# Patient Record
Sex: Male | Born: 1940
Health system: Southern US, Community
[De-identification: ages and names within clinical notes are randomized; demographics above are authoritative.]

## PROBLEM LIST (undated history)

## (undated) DIAGNOSIS — C801 Malignant (primary) neoplasm, unspecified: Secondary | ICD-10-CM

## (undated) DIAGNOSIS — T8859XA Other complications of anesthesia, initial encounter: Secondary | ICD-10-CM

## (undated) DIAGNOSIS — K219 Gastro-esophageal reflux disease without esophagitis: Secondary | ICD-10-CM

## (undated) DIAGNOSIS — M1990 Unspecified osteoarthritis, unspecified site: Secondary | ICD-10-CM

## (undated) DIAGNOSIS — G4730 Sleep apnea, unspecified: Secondary | ICD-10-CM

## (undated) DIAGNOSIS — E119 Type 2 diabetes mellitus without complications: Secondary | ICD-10-CM

## (undated) HISTORY — PX: OTHER SURGICAL HISTORY: SHX169

## (undated) HISTORY — PX: BACK SURGERY: SHX140

---

## 2007-09-30 ENCOUNTER — Ambulatory Visit: Payer: Self-pay | Admitting: Internal Medicine

## 2007-12-27 ENCOUNTER — Ambulatory Visit: Payer: Self-pay | Admitting: Internal Medicine

## 2008-01-03 ENCOUNTER — Ambulatory Visit: Payer: Self-pay | Admitting: Internal Medicine

## 2009-04-30 ENCOUNTER — Ambulatory Visit: Payer: Self-pay | Admitting: Internal Medicine

## 2009-06-13 ENCOUNTER — Encounter: Admission: RE | Admit: 2009-06-13 | Discharge: 2009-06-13 | Payer: Self-pay | Admitting: Neurosurgery

## 2010-04-24 ENCOUNTER — Emergency Department: Payer: Self-pay | Admitting: Emergency Medicine

## 2011-06-09 ENCOUNTER — Ambulatory Visit: Payer: Self-pay | Admitting: Internal Medicine

## 2011-07-22 ENCOUNTER — Other Ambulatory Visit: Payer: Self-pay | Admitting: Neurosurgery

## 2011-07-22 DIAGNOSIS — M542 Cervicalgia: Secondary | ICD-10-CM

## 2011-07-22 DIAGNOSIS — M5410 Radiculopathy, site unspecified: Secondary | ICD-10-CM

## 2011-07-22 DIAGNOSIS — M549 Dorsalgia, unspecified: Secondary | ICD-10-CM

## 2011-08-24 ENCOUNTER — Ambulatory Visit
Admission: RE | Admit: 2011-08-24 | Discharge: 2011-08-24 | Disposition: A | Discharge status: Home / Self Care | Payer: Medicare Other | Source: Ambulatory Visit | Attending: Neurosurgery | Admitting: Neurosurgery

## 2011-08-24 DIAGNOSIS — M542 Cervicalgia: Secondary | ICD-10-CM

## 2011-08-24 DIAGNOSIS — M5410 Radiculopathy, site unspecified: Secondary | ICD-10-CM

## 2011-08-24 DIAGNOSIS — M549 Dorsalgia, unspecified: Secondary | ICD-10-CM

## 2011-08-24 MED ORDER — IOHEXOL 300 MG/ML  SOLN
10.0000 mL | Freq: Once | INTRAMUSCULAR | Status: AC | PRN
  Administered 2011-08-24: 10 mL via INTRATHECAL

## 2011-08-24 MED ORDER — ONDANSETRON HCL 4 MG/2ML IJ SOLN: 4.0000 mg | Freq: Four times a day (QID) | INTRAMUSCULAR | Status: DC | PRN

## 2011-08-24 MED ORDER — DIAZEPAM 5 MG PO TABS
5.0000 mg | ORAL_TABLET | Freq: Once | ORAL | Status: AC
  Administered 2011-08-24: 5 mg via ORAL

## 2011-08-24 NOTE — Progress Notes (Signed)
Explained discharge instructions, consent signed and valium given. Procedure explained as well.

## 2013-05-05 ENCOUNTER — Ambulatory Visit: Payer: Self-pay | Admitting: Internal Medicine

## 2013-05-29 ENCOUNTER — Other Ambulatory Visit: Payer: Self-pay | Admitting: Neurosurgery

## 2013-05-29 DIAGNOSIS — M545 Low back pain, unspecified: Secondary | ICD-10-CM | POA: Insufficient documentation

## 2013-05-30 ENCOUNTER — Ambulatory Visit
Admission: RE | Admit: 2013-05-30 | Discharge: 2013-05-30 | Disposition: A | Discharge status: Home / Self Care | Payer: Medicare Other | Source: Ambulatory Visit | Attending: Neurosurgery | Admitting: Neurosurgery

## 2013-05-30 DIAGNOSIS — M545 Low back pain: Secondary | ICD-10-CM

## 2013-05-31 ENCOUNTER — Other Ambulatory Visit: Payer: Medicare Other

## 2013-08-15 DIAGNOSIS — R197 Diarrhea, unspecified: Secondary | ICD-10-CM | POA: Diagnosis not present

## 2013-08-15 DIAGNOSIS — M5430 Sciatica, unspecified side: Secondary | ICD-10-CM | POA: Diagnosis not present

## 2013-08-15 DIAGNOSIS — E119 Type 2 diabetes mellitus without complications: Secondary | ICD-10-CM | POA: Diagnosis not present

## 2013-08-15 DIAGNOSIS — R413 Other amnesia: Secondary | ICD-10-CM | POA: Diagnosis not present

## 2013-08-21 DIAGNOSIS — Z79899 Other long term (current) drug therapy: Secondary | ICD-10-CM | POA: Diagnosis not present

## 2013-08-21 DIAGNOSIS — M48061 Spinal stenosis, lumbar region without neurogenic claudication: Secondary | ICD-10-CM | POA: Diagnosis not present

## 2013-08-23 ENCOUNTER — Ambulatory Visit: Payer: Self-pay | Admitting: Internal Medicine

## 2013-08-23 DIAGNOSIS — K7689 Other specified diseases of liver: Secondary | ICD-10-CM | POA: Diagnosis not present

## 2013-08-23 DIAGNOSIS — R109 Unspecified abdominal pain: Secondary | ICD-10-CM | POA: Diagnosis not present

## 2013-09-18 DIAGNOSIS — M5430 Sciatica, unspecified side: Secondary | ICD-10-CM | POA: Diagnosis not present

## 2013-09-18 DIAGNOSIS — K219 Gastro-esophageal reflux disease without esophagitis: Secondary | ICD-10-CM | POA: Diagnosis not present

## 2013-09-18 DIAGNOSIS — M5030 Other cervical disc degeneration, unspecified cervical region: Secondary | ICD-10-CM | POA: Diagnosis not present

## 2013-09-18 DIAGNOSIS — E119 Type 2 diabetes mellitus without complications: Secondary | ICD-10-CM | POA: Diagnosis not present

## 2013-10-03 DIAGNOSIS — D0460 Carcinoma in situ of skin of unspecified upper limb, including shoulder: Secondary | ICD-10-CM | POA: Diagnosis not present

## 2013-10-03 DIAGNOSIS — R197 Diarrhea, unspecified: Secondary | ICD-10-CM | POA: Diagnosis not present

## 2013-10-03 DIAGNOSIS — R1013 Epigastric pain: Secondary | ICD-10-CM | POA: Diagnosis not present

## 2013-10-03 DIAGNOSIS — R12 Heartburn: Secondary | ICD-10-CM | POA: Diagnosis not present

## 2013-10-03 DIAGNOSIS — R110 Nausea: Secondary | ICD-10-CM | POA: Diagnosis not present

## 2013-10-03 DIAGNOSIS — D0430 Carcinoma in situ of skin of unspecified part of face: Secondary | ICD-10-CM | POA: Diagnosis not present

## 2013-10-03 DIAGNOSIS — D0439 Carcinoma in situ of skin of other parts of face: Secondary | ICD-10-CM | POA: Diagnosis not present

## 2013-10-11 ENCOUNTER — Ambulatory Visit: Payer: Self-pay | Admitting: Gastroenterology

## 2013-10-11 DIAGNOSIS — R1011 Right upper quadrant pain: Secondary | ICD-10-CM | POA: Diagnosis not present

## 2013-10-11 DIAGNOSIS — R748 Abnormal levels of other serum enzymes: Secondary | ICD-10-CM | POA: Diagnosis not present

## 2013-10-11 DIAGNOSIS — R1013 Epigastric pain: Secondary | ICD-10-CM | POA: Diagnosis not present

## 2013-10-12 DIAGNOSIS — C44211 Basal cell carcinoma of skin of unspecified ear and external auricular canal: Secondary | ICD-10-CM | POA: Diagnosis not present

## 2013-10-13 ENCOUNTER — Ambulatory Visit: Payer: Self-pay | Admitting: Anesthesiology

## 2013-10-13 DIAGNOSIS — M5430 Sciatica, unspecified side: Secondary | ICD-10-CM | POA: Diagnosis not present

## 2013-10-13 DIAGNOSIS — Z01810 Encounter for preprocedural cardiovascular examination: Secondary | ICD-10-CM | POA: Diagnosis not present

## 2013-10-13 DIAGNOSIS — E119 Type 2 diabetes mellitus without complications: Secondary | ICD-10-CM | POA: Diagnosis not present

## 2013-10-13 DIAGNOSIS — M4800 Spinal stenosis, site unspecified: Secondary | ICD-10-CM | POA: Diagnosis not present

## 2013-10-13 DIAGNOSIS — M5030 Other cervical disc degeneration, unspecified cervical region: Secondary | ICD-10-CM | POA: Diagnosis not present

## 2013-10-16 ENCOUNTER — Ambulatory Visit: Payer: Self-pay | Admitting: Gastroenterology

## 2013-10-16 DIAGNOSIS — D126 Benign neoplasm of colon, unspecified: Secondary | ICD-10-CM | POA: Diagnosis not present

## 2013-10-16 DIAGNOSIS — Z9109 Other allergy status, other than to drugs and biological substances: Secondary | ICD-10-CM | POA: Diagnosis not present

## 2013-10-16 DIAGNOSIS — M5030 Other cervical disc degeneration, unspecified cervical region: Secondary | ICD-10-CM | POA: Diagnosis not present

## 2013-10-16 DIAGNOSIS — I10 Essential (primary) hypertension: Secondary | ICD-10-CM | POA: Diagnosis not present

## 2013-10-16 DIAGNOSIS — R1013 Epigastric pain: Secondary | ICD-10-CM | POA: Diagnosis not present

## 2013-10-16 DIAGNOSIS — M4800 Spinal stenosis, site unspecified: Secondary | ICD-10-CM | POA: Diagnosis not present

## 2013-10-16 DIAGNOSIS — E669 Obesity, unspecified: Secondary | ICD-10-CM | POA: Diagnosis not present

## 2013-10-16 DIAGNOSIS — K209 Esophagitis, unspecified without bleeding: Secondary | ICD-10-CM | POA: Diagnosis not present

## 2013-10-16 DIAGNOSIS — Z6829 Body mass index (BMI) 29.0-29.9, adult: Secondary | ICD-10-CM | POA: Diagnosis not present

## 2013-10-16 DIAGNOSIS — Z882 Allergy status to sulfonamides status: Secondary | ICD-10-CM | POA: Diagnosis not present

## 2013-10-16 DIAGNOSIS — M129 Arthropathy, unspecified: Secondary | ICD-10-CM | POA: Diagnosis not present

## 2013-10-16 DIAGNOSIS — K219 Gastro-esophageal reflux disease without esophagitis: Secondary | ICD-10-CM | POA: Diagnosis not present

## 2013-10-16 DIAGNOSIS — K2970 Gastritis, unspecified, without bleeding: Secondary | ICD-10-CM | POA: Diagnosis not present

## 2013-10-16 DIAGNOSIS — K648 Other hemorrhoids: Secondary | ICD-10-CM | POA: Diagnosis not present

## 2013-10-16 DIAGNOSIS — Z79899 Other long term (current) drug therapy: Secondary | ICD-10-CM | POA: Diagnosis not present

## 2013-10-16 DIAGNOSIS — M5430 Sciatica, unspecified side: Secondary | ICD-10-CM | POA: Diagnosis not present

## 2013-10-16 DIAGNOSIS — Z1211 Encounter for screening for malignant neoplasm of colon: Secondary | ICD-10-CM | POA: Diagnosis not present

## 2013-10-16 DIAGNOSIS — D129 Benign neoplasm of anus and anal canal: Secondary | ICD-10-CM | POA: Diagnosis not present

## 2013-10-16 DIAGNOSIS — K621 Rectal polyp: Secondary | ICD-10-CM | POA: Diagnosis not present

## 2013-10-16 DIAGNOSIS — K2100 Gastro-esophageal reflux disease with esophagitis, without bleeding: Secondary | ICD-10-CM | POA: Diagnosis not present

## 2013-10-16 DIAGNOSIS — K620 Anal polyp: Secondary | ICD-10-CM | POA: Diagnosis not present

## 2013-10-16 DIAGNOSIS — K2990 Gastroduodenitis, unspecified, without bleeding: Secondary | ICD-10-CM | POA: Diagnosis not present

## 2013-10-16 DIAGNOSIS — R197 Diarrhea, unspecified: Secondary | ICD-10-CM | POA: Diagnosis not present

## 2013-10-16 DIAGNOSIS — F29 Unspecified psychosis not due to a substance or known physiological condition: Secondary | ICD-10-CM | POA: Diagnosis not present

## 2013-10-16 DIAGNOSIS — Z7982 Long term (current) use of aspirin: Secondary | ICD-10-CM | POA: Diagnosis not present

## 2013-11-29 ENCOUNTER — Ambulatory Visit: Payer: Self-pay | Admitting: Internal Medicine

## 2013-11-29 DIAGNOSIS — Z85820 Personal history of malignant melanoma of skin: Secondary | ICD-10-CM | POA: Diagnosis not present

## 2013-11-29 DIAGNOSIS — C439 Malignant melanoma of skin, unspecified: Secondary | ICD-10-CM | POA: Diagnosis not present

## 2013-12-08 DIAGNOSIS — M5030 Other cervical disc degeneration, unspecified cervical region: Secondary | ICD-10-CM | POA: Diagnosis not present

## 2013-12-08 DIAGNOSIS — E119 Type 2 diabetes mellitus without complications: Secondary | ICD-10-CM | POA: Diagnosis not present

## 2013-12-08 DIAGNOSIS — M4800 Spinal stenosis, site unspecified: Secondary | ICD-10-CM | POA: Diagnosis not present

## 2013-12-08 DIAGNOSIS — M5430 Sciatica, unspecified side: Secondary | ICD-10-CM | POA: Diagnosis not present

## 2013-12-23 DIAGNOSIS — R6889 Other general symptoms and signs: Secondary | ICD-10-CM | POA: Diagnosis not present

## 2013-12-23 DIAGNOSIS — R079 Chest pain, unspecified: Secondary | ICD-10-CM | POA: Diagnosis not present

## 2014-01-30 DIAGNOSIS — L570 Actinic keratosis: Secondary | ICD-10-CM | POA: Diagnosis not present

## 2014-01-30 DIAGNOSIS — C44319 Basal cell carcinoma of skin of other parts of face: Secondary | ICD-10-CM | POA: Diagnosis not present

## 2014-01-30 DIAGNOSIS — Z85820 Personal history of malignant melanoma of skin: Secondary | ICD-10-CM | POA: Diagnosis not present

## 2014-01-30 DIAGNOSIS — D485 Neoplasm of uncertain behavior of skin: Secondary | ICD-10-CM | POA: Diagnosis not present

## 2014-01-30 DIAGNOSIS — D0460 Carcinoma in situ of skin of unspecified upper limb, including shoulder: Secondary | ICD-10-CM | POA: Diagnosis not present

## 2014-03-06 DIAGNOSIS — I10 Essential (primary) hypertension: Secondary | ICD-10-CM | POA: Diagnosis not present

## 2014-03-06 DIAGNOSIS — E119 Type 2 diabetes mellitus without complications: Secondary | ICD-10-CM | POA: Diagnosis not present

## 2014-03-06 DIAGNOSIS — M5030 Other cervical disc degeneration, unspecified cervical region: Secondary | ICD-10-CM | POA: Diagnosis not present

## 2014-03-06 DIAGNOSIS — Z125 Encounter for screening for malignant neoplasm of prostate: Secondary | ICD-10-CM | POA: Diagnosis not present

## 2014-03-06 DIAGNOSIS — Z0000 Encounter for general adult medical examination without abnormal findings: Secondary | ICD-10-CM | POA: Diagnosis not present

## 2014-03-06 DIAGNOSIS — D649 Anemia, unspecified: Secondary | ICD-10-CM | POA: Diagnosis not present

## 2014-03-06 DIAGNOSIS — E785 Hyperlipidemia, unspecified: Secondary | ICD-10-CM | POA: Diagnosis not present

## 2014-03-13 DIAGNOSIS — E119 Type 2 diabetes mellitus without complications: Secondary | ICD-10-CM | POA: Diagnosis not present

## 2014-03-13 DIAGNOSIS — M5030 Other cervical disc degeneration, unspecified cervical region: Secondary | ICD-10-CM | POA: Diagnosis not present

## 2014-03-13 DIAGNOSIS — M4800 Spinal stenosis, site unspecified: Secondary | ICD-10-CM | POA: Diagnosis not present

## 2014-03-19 DIAGNOSIS — C44320 Squamous cell carcinoma of skin of unspecified parts of face: Secondary | ICD-10-CM | POA: Diagnosis not present

## 2014-03-20 DIAGNOSIS — R972 Elevated prostate specific antigen [PSA]: Secondary | ICD-10-CM | POA: Diagnosis not present

## 2014-03-20 DIAGNOSIS — N401 Enlarged prostate with lower urinary tract symptoms: Secondary | ICD-10-CM | POA: Diagnosis not present

## 2014-04-02 DIAGNOSIS — D0460 Carcinoma in situ of skin of unspecified upper limb, including shoulder: Secondary | ICD-10-CM | POA: Diagnosis not present

## 2014-04-03 DIAGNOSIS — J398 Other specified diseases of upper respiratory tract: Secondary | ICD-10-CM | POA: Diagnosis not present

## 2014-04-03 DIAGNOSIS — J441 Chronic obstructive pulmonary disease with (acute) exacerbation: Secondary | ICD-10-CM | POA: Diagnosis not present

## 2014-05-09 DIAGNOSIS — Z23 Encounter for immunization: Secondary | ICD-10-CM | POA: Diagnosis not present

## 2014-06-04 DIAGNOSIS — M4800 Spinal stenosis, site unspecified: Secondary | ICD-10-CM | POA: Diagnosis not present

## 2014-06-04 DIAGNOSIS — M5093 Cervical disc disorder, unspecified, cervicothoracic region: Secondary | ICD-10-CM | POA: Diagnosis not present

## 2014-06-04 DIAGNOSIS — I10 Essential (primary) hypertension: Secondary | ICD-10-CM | POA: Diagnosis not present

## 2014-06-04 DIAGNOSIS — E119 Type 2 diabetes mellitus without complications: Secondary | ICD-10-CM | POA: Diagnosis not present

## 2014-07-09 DIAGNOSIS — R509 Fever, unspecified: Secondary | ICD-10-CM | POA: Diagnosis not present

## 2014-07-09 DIAGNOSIS — J029 Acute pharyngitis, unspecified: Secondary | ICD-10-CM | POA: Diagnosis not present

## 2014-07-09 DIAGNOSIS — J0100 Acute maxillary sinusitis, unspecified: Secondary | ICD-10-CM | POA: Diagnosis not present

## 2014-07-13 DIAGNOSIS — E1165 Type 2 diabetes mellitus with hyperglycemia: Secondary | ICD-10-CM | POA: Diagnosis not present

## 2014-07-13 DIAGNOSIS — J320 Chronic maxillary sinusitis: Secondary | ICD-10-CM | POA: Diagnosis not present

## 2014-07-13 DIAGNOSIS — J399 Disease of upper respiratory tract, unspecified: Secondary | ICD-10-CM | POA: Diagnosis not present

## 2014-07-13 DIAGNOSIS — J0121 Acute recurrent ethmoidal sinusitis: Secondary | ICD-10-CM | POA: Diagnosis not present

## 2014-07-17 ENCOUNTER — Ambulatory Visit: Payer: Self-pay | Admitting: Internal Medicine

## 2014-07-17 DIAGNOSIS — J399 Disease of upper respiratory tract, unspecified: Secondary | ICD-10-CM | POA: Diagnosis not present

## 2014-07-17 DIAGNOSIS — I10 Essential (primary) hypertension: Secondary | ICD-10-CM | POA: Diagnosis not present

## 2014-07-17 DIAGNOSIS — R091 Pleurisy: Secondary | ICD-10-CM | POA: Diagnosis not present

## 2014-07-17 DIAGNOSIS — M4800 Spinal stenosis, site unspecified: Secondary | ICD-10-CM | POA: Diagnosis not present

## 2014-07-17 DIAGNOSIS — E119 Type 2 diabetes mellitus without complications: Secondary | ICD-10-CM | POA: Diagnosis not present

## 2014-07-17 DIAGNOSIS — J129 Viral pneumonia, unspecified: Secondary | ICD-10-CM | POA: Diagnosis not present

## 2014-07-17 DIAGNOSIS — R05 Cough: Secondary | ICD-10-CM | POA: Diagnosis not present

## 2014-07-17 DIAGNOSIS — E784 Other hyperlipidemia: Secondary | ICD-10-CM | POA: Diagnosis not present

## 2014-07-23 DIAGNOSIS — M5093 Cervical disc disorder, unspecified, cervicothoracic region: Secondary | ICD-10-CM | POA: Diagnosis not present

## 2014-07-23 DIAGNOSIS — M5136 Other intervertebral disc degeneration, lumbar region: Secondary | ICD-10-CM | POA: Diagnosis not present

## 2014-07-23 DIAGNOSIS — M4800 Spinal stenosis, site unspecified: Secondary | ICD-10-CM | POA: Diagnosis not present

## 2014-07-23 DIAGNOSIS — M5416 Radiculopathy, lumbar region: Secondary | ICD-10-CM | POA: Diagnosis not present

## 2014-07-23 DIAGNOSIS — E119 Type 2 diabetes mellitus without complications: Secondary | ICD-10-CM | POA: Diagnosis not present

## 2014-07-23 DIAGNOSIS — M5430 Sciatica, unspecified side: Secondary | ICD-10-CM | POA: Diagnosis not present

## 2014-08-10 DIAGNOSIS — D2271 Melanocytic nevi of right lower limb, including hip: Secondary | ICD-10-CM | POA: Diagnosis not present

## 2014-08-10 DIAGNOSIS — L570 Actinic keratosis: Secondary | ICD-10-CM | POA: Diagnosis not present

## 2014-08-10 DIAGNOSIS — D2261 Melanocytic nevi of right upper limb, including shoulder: Secondary | ICD-10-CM | POA: Diagnosis not present

## 2014-08-10 DIAGNOSIS — D485 Neoplasm of uncertain behavior of skin: Secondary | ICD-10-CM | POA: Diagnosis not present

## 2014-08-10 DIAGNOSIS — D0439 Carcinoma in situ of skin of other parts of face: Secondary | ICD-10-CM | POA: Diagnosis not present

## 2014-08-10 DIAGNOSIS — Z85820 Personal history of malignant melanoma of skin: Secondary | ICD-10-CM | POA: Diagnosis not present

## 2014-08-10 DIAGNOSIS — Z85828 Personal history of other malignant neoplasm of skin: Secondary | ICD-10-CM | POA: Diagnosis not present

## 2014-08-10 DIAGNOSIS — C44311 Basal cell carcinoma of skin of nose: Secondary | ICD-10-CM | POA: Diagnosis not present

## 2014-08-10 DIAGNOSIS — X32XXXA Exposure to sunlight, initial encounter: Secondary | ICD-10-CM | POA: Diagnosis not present

## 2014-08-29 DIAGNOSIS — R972 Elevated prostate specific antigen [PSA]: Secondary | ICD-10-CM | POA: Diagnosis not present

## 2014-09-05 DIAGNOSIS — N400 Enlarged prostate without lower urinary tract symptoms: Secondary | ICD-10-CM | POA: Diagnosis not present

## 2014-09-05 DIAGNOSIS — R972 Elevated prostate specific antigen [PSA]: Secondary | ICD-10-CM | POA: Diagnosis not present

## 2014-09-06 DIAGNOSIS — M5093 Cervical disc disorder, unspecified, cervicothoracic region: Secondary | ICD-10-CM | POA: Diagnosis not present

## 2014-09-06 DIAGNOSIS — M5430 Sciatica, unspecified side: Secondary | ICD-10-CM | POA: Diagnosis not present

## 2014-09-20 DIAGNOSIS — D0439 Carcinoma in situ of skin of other parts of face: Secondary | ICD-10-CM | POA: Diagnosis not present

## 2014-10-30 DIAGNOSIS — M5430 Sciatica, unspecified side: Secondary | ICD-10-CM | POA: Diagnosis not present

## 2014-10-30 DIAGNOSIS — E119 Type 2 diabetes mellitus without complications: Secondary | ICD-10-CM | POA: Diagnosis not present

## 2014-11-13 DIAGNOSIS — C44311 Basal cell carcinoma of skin of nose: Secondary | ICD-10-CM | POA: Diagnosis not present

## 2014-11-28 DIAGNOSIS — E119 Type 2 diabetes mellitus without complications: Secondary | ICD-10-CM | POA: Diagnosis not present

## 2014-11-28 DIAGNOSIS — E1165 Type 2 diabetes mellitus with hyperglycemia: Secondary | ICD-10-CM | POA: Diagnosis not present

## 2014-11-28 DIAGNOSIS — Z1389 Encounter for screening for other disorder: Secondary | ICD-10-CM | POA: Diagnosis not present

## 2014-11-28 DIAGNOSIS — M5430 Sciatica, unspecified side: Secondary | ICD-10-CM | POA: Diagnosis not present

## 2014-12-19 DIAGNOSIS — D485 Neoplasm of uncertain behavior of skin: Secondary | ICD-10-CM | POA: Diagnosis not present

## 2014-12-19 DIAGNOSIS — Z85828 Personal history of other malignant neoplasm of skin: Secondary | ICD-10-CM | POA: Diagnosis not present

## 2014-12-19 DIAGNOSIS — C44619 Basal cell carcinoma of skin of left upper limb, including shoulder: Secondary | ICD-10-CM | POA: Diagnosis not present

## 2014-12-19 DIAGNOSIS — X32XXXA Exposure to sunlight, initial encounter: Secondary | ICD-10-CM | POA: Diagnosis not present

## 2014-12-19 DIAGNOSIS — L570 Actinic keratosis: Secondary | ICD-10-CM | POA: Diagnosis not present

## 2014-12-19 DIAGNOSIS — D0461 Carcinoma in situ of skin of right upper limb, including shoulder: Secondary | ICD-10-CM | POA: Diagnosis not present

## 2014-12-19 DIAGNOSIS — Z85820 Personal history of malignant melanoma of skin: Secondary | ICD-10-CM | POA: Diagnosis not present

## 2014-12-26 DIAGNOSIS — D0461 Carcinoma in situ of skin of right upper limb, including shoulder: Secondary | ICD-10-CM | POA: Diagnosis not present

## 2014-12-26 DIAGNOSIS — C44619 Basal cell carcinoma of skin of left upper limb, including shoulder: Secondary | ICD-10-CM | POA: Diagnosis not present

## 2015-01-28 DIAGNOSIS — M5430 Sciatica, unspecified side: Secondary | ICD-10-CM | POA: Diagnosis not present

## 2015-01-28 DIAGNOSIS — M549 Dorsalgia, unspecified: Secondary | ICD-10-CM | POA: Diagnosis not present

## 2015-01-28 DIAGNOSIS — M4692 Unspecified inflammatory spondylopathy, cervical region: Secondary | ICD-10-CM | POA: Diagnosis not present

## 2015-01-28 DIAGNOSIS — M4800 Spinal stenosis, site unspecified: Secondary | ICD-10-CM | POA: Diagnosis not present

## 2015-03-04 DIAGNOSIS — I10 Essential (primary) hypertension: Secondary | ICD-10-CM | POA: Diagnosis not present

## 2015-03-04 DIAGNOSIS — M5093 Cervical disc disorder, unspecified, cervicothoracic region: Secondary | ICD-10-CM | POA: Diagnosis not present

## 2015-03-04 DIAGNOSIS — M5430 Sciatica, unspecified side: Secondary | ICD-10-CM | POA: Diagnosis not present

## 2015-04-10 DIAGNOSIS — M4800 Spinal stenosis, site unspecified: Secondary | ICD-10-CM | POA: Diagnosis not present

## 2015-04-10 DIAGNOSIS — H9209 Otalgia, unspecified ear: Secondary | ICD-10-CM | POA: Diagnosis not present

## 2015-04-10 DIAGNOSIS — H60391 Other infective otitis externa, right ear: Secondary | ICD-10-CM | POA: Diagnosis not present

## 2015-04-10 DIAGNOSIS — H9201 Otalgia, right ear: Secondary | ICD-10-CM | POA: Diagnosis not present

## 2015-04-23 DIAGNOSIS — J9801 Acute bronchospasm: Secondary | ICD-10-CM | POA: Diagnosis not present

## 2015-04-23 DIAGNOSIS — J069 Acute upper respiratory infection, unspecified: Secondary | ICD-10-CM | POA: Diagnosis not present

## 2015-04-23 DIAGNOSIS — B9789 Other viral agents as the cause of diseases classified elsewhere: Secondary | ICD-10-CM | POA: Diagnosis not present

## 2015-04-23 DIAGNOSIS — J218 Acute bronchiolitis due to other specified organisms: Secondary | ICD-10-CM | POA: Diagnosis not present

## 2015-04-24 DIAGNOSIS — E291 Testicular hypofunction: Secondary | ICD-10-CM | POA: Diagnosis not present

## 2015-04-24 DIAGNOSIS — R972 Elevated prostate specific antigen [PSA]: Secondary | ICD-10-CM | POA: Diagnosis not present

## 2015-04-24 DIAGNOSIS — N5201 Erectile dysfunction due to arterial insufficiency: Secondary | ICD-10-CM | POA: Diagnosis not present

## 2015-05-01 DIAGNOSIS — D2261 Melanocytic nevi of right upper limb, including shoulder: Secondary | ICD-10-CM | POA: Diagnosis not present

## 2015-05-01 DIAGNOSIS — L570 Actinic keratosis: Secondary | ICD-10-CM | POA: Diagnosis not present

## 2015-05-01 DIAGNOSIS — L280 Lichen simplex chronicus: Secondary | ICD-10-CM | POA: Diagnosis not present

## 2015-05-01 DIAGNOSIS — Z85828 Personal history of other malignant neoplasm of skin: Secondary | ICD-10-CM | POA: Diagnosis not present

## 2015-05-01 DIAGNOSIS — D485 Neoplasm of uncertain behavior of skin: Secondary | ICD-10-CM | POA: Diagnosis not present

## 2015-05-01 DIAGNOSIS — X32XXXA Exposure to sunlight, initial encounter: Secondary | ICD-10-CM | POA: Diagnosis not present

## 2015-05-01 DIAGNOSIS — D225 Melanocytic nevi of trunk: Secondary | ICD-10-CM | POA: Diagnosis not present

## 2015-05-01 DIAGNOSIS — Z85820 Personal history of malignant melanoma of skin: Secondary | ICD-10-CM | POA: Diagnosis not present

## 2015-05-02 ENCOUNTER — Other Ambulatory Visit (HOSPITAL_COMMUNITY): Payer: Self-pay | Admitting: Urology

## 2015-05-02 DIAGNOSIS — R972 Elevated prostate specific antigen [PSA]: Secondary | ICD-10-CM

## 2015-05-13 ENCOUNTER — Ambulatory Visit (HOSPITAL_COMMUNITY): Admission: RE | Admit: 2015-05-13 | Payer: Medicare Other | Source: Ambulatory Visit

## 2015-05-14 ENCOUNTER — Ambulatory Visit (HOSPITAL_COMMUNITY)
Admission: RE | Admit: 2015-05-14 | Discharge: 2015-05-14 | Disposition: A | Discharge status: Home / Self Care | Payer: Medicare Other | Source: Ambulatory Visit | Attending: Urology | Admitting: Urology

## 2015-05-14 DIAGNOSIS — K5730 Diverticulosis of large intestine without perforation or abscess without bleeding: Secondary | ICD-10-CM | POA: Diagnosis not present

## 2015-05-14 DIAGNOSIS — R972 Elevated prostate specific antigen [PSA]: Secondary | ICD-10-CM | POA: Insufficient documentation

## 2015-05-14 DIAGNOSIS — K4090 Unilateral inguinal hernia, without obstruction or gangrene, not specified as recurrent: Secondary | ICD-10-CM | POA: Insufficient documentation

## 2015-05-14 LAB — CREATININE, SERUM
Creatinine, Ser: 1.28 mg/dL — ABNORMAL HIGH (ref 0.61–1.24)
GFR calc Af Amer: >60 mL/min (ref >60)
GFR, EST NON AFRICAN AMERICAN: 54 mL/min — AB (ref >60)

## 2015-05-14 MED ORDER — GADOBENATE DIMEGLUMINE 529 MG/ML IV SOLN
20.0000 mL | Freq: Once | INTRAVENOUS | Status: AC | PRN
  Administered 2015-05-14: 20 mL via INTRAVENOUS

## 2015-05-22 DIAGNOSIS — R972 Elevated prostate specific antigen [PSA]: Secondary | ICD-10-CM | POA: Diagnosis not present

## 2015-05-29 ENCOUNTER — Other Ambulatory Visit: Payer: Self-pay | Admitting: Cardiology

## 2015-05-29 DIAGNOSIS — M4800 Spinal stenosis, site unspecified: Secondary | ICD-10-CM | POA: Diagnosis not present

## 2015-05-29 DIAGNOSIS — M5093 Cervical disc disorder, unspecified, cervicothoracic region: Secondary | ICD-10-CM | POA: Diagnosis not present

## 2015-05-29 DIAGNOSIS — R972 Elevated prostate specific antigen [PSA]: Secondary | ICD-10-CM | POA: Diagnosis not present

## 2015-05-29 DIAGNOSIS — R42 Dizziness and giddiness: Secondary | ICD-10-CM

## 2015-05-29 DIAGNOSIS — Z23 Encounter for immunization: Secondary | ICD-10-CM | POA: Diagnosis not present

## 2015-05-29 DIAGNOSIS — M5430 Sciatica, unspecified side: Secondary | ICD-10-CM | POA: Diagnosis not present

## 2015-05-29 DIAGNOSIS — N402 Nodular prostate without lower urinary tract symptoms: Secondary | ICD-10-CM | POA: Diagnosis not present

## 2015-06-05 ENCOUNTER — Ambulatory Visit
Admission: RE | Admit: 2015-06-05 | Discharge: 2015-06-05 | Disposition: A | Discharge status: Home / Self Care | Payer: Medicare Other | Source: Ambulatory Visit | Attending: Cardiology | Admitting: Cardiology

## 2015-06-05 DIAGNOSIS — R42 Dizziness and giddiness: Secondary | ICD-10-CM | POA: Insufficient documentation

## 2015-06-06 DIAGNOSIS — C61 Malignant neoplasm of prostate: Secondary | ICD-10-CM | POA: Diagnosis not present

## 2015-06-06 DIAGNOSIS — R972 Elevated prostate specific antigen [PSA]: Secondary | ICD-10-CM | POA: Diagnosis not present

## 2015-06-10 ENCOUNTER — Other Ambulatory Visit: Payer: Self-pay | Admitting: Urology

## 2015-06-10 DIAGNOSIS — C61 Malignant neoplasm of prostate: Secondary | ICD-10-CM

## 2015-06-20 DIAGNOSIS — C61 Malignant neoplasm of prostate: Secondary | ICD-10-CM | POA: Diagnosis not present

## 2015-06-21 ENCOUNTER — Encounter
Admission: RE | Admit: 2015-06-21 | Discharge: 2015-06-21 | Disposition: A | Discharge status: Home / Self Care | Payer: Medicare Other | Source: Ambulatory Visit | Attending: Urology | Admitting: Urology

## 2015-06-21 ENCOUNTER — Ambulatory Visit
Admission: RE | Admit: 2015-06-21 | Discharge: 2015-06-21 | Disposition: A | Discharge status: Home / Self Care | Payer: Medicare Other | Source: Ambulatory Visit | Attending: Urology | Admitting: Urology

## 2015-06-21 ENCOUNTER — Encounter: Admission: RE | Admit: 2015-06-21 | Payer: Medicare Other | Source: Ambulatory Visit

## 2015-06-21 DIAGNOSIS — K449 Diaphragmatic hernia without obstruction or gangrene: Secondary | ICD-10-CM | POA: Insufficient documentation

## 2015-06-21 DIAGNOSIS — M170 Bilateral primary osteoarthritis of knee: Secondary | ICD-10-CM | POA: Insufficient documentation

## 2015-06-21 DIAGNOSIS — M47816 Spondylosis without myelopathy or radiculopathy, lumbar region: Secondary | ICD-10-CM | POA: Diagnosis not present

## 2015-06-21 DIAGNOSIS — C61 Malignant neoplasm of prostate: Secondary | ICD-10-CM | POA: Diagnosis not present

## 2015-06-21 DIAGNOSIS — I7090 Unspecified atherosclerosis: Secondary | ICD-10-CM | POA: Diagnosis not present

## 2015-06-21 DIAGNOSIS — N281 Cyst of kidney, acquired: Secondary | ICD-10-CM | POA: Diagnosis not present

## 2015-06-21 DIAGNOSIS — M5136 Other intervertebral disc degeneration, lumbar region: Secondary | ICD-10-CM | POA: Insufficient documentation

## 2015-06-21 HISTORY — DX: Malignant (primary) neoplasm, unspecified: C80.1

## 2015-06-21 HISTORY — DX: Type 2 diabetes mellitus without complications: E11.9

## 2015-06-21 MED ORDER — IOHEXOL 300 MG/ML  SOLN
100.0000 mL | Freq: Once | INTRAMUSCULAR | Status: AC | PRN
  Administered 2015-06-21: 100 mL via INTRAVENOUS

## 2015-06-21 MED ORDER — TECHNETIUM TC 99M MEDRONATE IV KIT
23.6100 | PACK | Freq: Once | INTRAVENOUS | Status: AC | PRN
  Administered 2015-06-21: 23.61 via INTRAVENOUS

## 2015-06-25 DIAGNOSIS — Z23 Encounter for immunization: Secondary | ICD-10-CM | POA: Diagnosis not present

## 2015-06-25 DIAGNOSIS — C61 Malignant neoplasm of prostate: Secondary | ICD-10-CM | POA: Diagnosis not present

## 2015-06-25 DIAGNOSIS — R972 Elevated prostate specific antigen [PSA]: Secondary | ICD-10-CM | POA: Diagnosis not present

## 2015-07-02 DIAGNOSIS — G894 Chronic pain syndrome: Secondary | ICD-10-CM | POA: Diagnosis not present

## 2015-07-02 DIAGNOSIS — M5430 Sciatica, unspecified side: Secondary | ICD-10-CM | POA: Diagnosis not present

## 2015-07-02 DIAGNOSIS — E119 Type 2 diabetes mellitus without complications: Secondary | ICD-10-CM | POA: Diagnosis not present

## 2015-07-02 DIAGNOSIS — M5093 Cervical disc disorder, unspecified, cervicothoracic region: Secondary | ICD-10-CM | POA: Diagnosis not present

## 2015-07-24 DIAGNOSIS — C61 Malignant neoplasm of prostate: Secondary | ICD-10-CM | POA: Diagnosis not present

## 2015-07-24 DIAGNOSIS — K635 Polyp of colon: Secondary | ICD-10-CM | POA: Diagnosis not present

## 2015-07-24 DIAGNOSIS — K3189 Other diseases of stomach and duodenum: Secondary | ICD-10-CM | POA: Diagnosis not present

## 2015-08-08 DIAGNOSIS — C61 Malignant neoplasm of prostate: Secondary | ICD-10-CM | POA: Diagnosis not present

## 2015-08-15 DIAGNOSIS — C61 Malignant neoplasm of prostate: Secondary | ICD-10-CM | POA: Diagnosis not present

## 2015-08-21 DIAGNOSIS — M1990 Unspecified osteoarthritis, unspecified site: Secondary | ICD-10-CM | POA: Diagnosis present

## 2015-08-21 DIAGNOSIS — E119 Type 2 diabetes mellitus without complications: Secondary | ICD-10-CM | POA: Diagnosis present

## 2015-08-21 DIAGNOSIS — N521 Erectile dysfunction due to diseases classified elsewhere: Secondary | ICD-10-CM | POA: Diagnosis present

## 2015-08-21 DIAGNOSIS — C61 Malignant neoplasm of prostate: Secondary | ICD-10-CM | POA: Diagnosis not present

## 2015-08-21 DIAGNOSIS — K439 Ventral hernia without obstruction or gangrene: Secondary | ICD-10-CM | POA: Diagnosis present

## 2015-08-21 DIAGNOSIS — K219 Gastro-esophageal reflux disease without esophagitis: Secondary | ICD-10-CM | POA: Diagnosis present

## 2015-08-21 DIAGNOSIS — M4800 Spinal stenosis, site unspecified: Secondary | ICD-10-CM | POA: Diagnosis present

## 2015-08-21 DIAGNOSIS — F329 Major depressive disorder, single episode, unspecified: Secondary | ICD-10-CM | POA: Diagnosis present

## 2015-08-21 DIAGNOSIS — R32 Unspecified urinary incontinence: Secondary | ICD-10-CM | POA: Diagnosis present

## 2015-08-21 DIAGNOSIS — G4733 Obstructive sleep apnea (adult) (pediatric): Secondary | ICD-10-CM | POA: Diagnosis present

## 2015-08-23 DIAGNOSIS — R4781 Slurred speech: Secondary | ICD-10-CM | POA: Diagnosis not present

## 2015-08-23 DIAGNOSIS — R443 Hallucinations, unspecified: Secondary | ICD-10-CM | POA: Diagnosis not present

## 2015-08-23 DIAGNOSIS — R05 Cough: Secondary | ICD-10-CM | POA: Diagnosis not present

## 2015-08-23 DIAGNOSIS — F29 Unspecified psychosis not due to a substance or known physiological condition: Secondary | ICD-10-CM | POA: Diagnosis not present

## 2015-08-23 DIAGNOSIS — C61 Malignant neoplasm of prostate: Secondary | ICD-10-CM | POA: Diagnosis not present

## 2015-08-23 DIAGNOSIS — Z5181 Encounter for therapeutic drug level monitoring: Secondary | ICD-10-CM | POA: Diagnosis not present

## 2015-08-23 DIAGNOSIS — R4182 Altered mental status, unspecified: Secondary | ICD-10-CM | POA: Diagnosis not present

## 2015-08-23 DIAGNOSIS — R410 Disorientation, unspecified: Secondary | ICD-10-CM | POA: Diagnosis not present

## 2015-08-23 DIAGNOSIS — F6389 Other impulse disorders: Secondary | ICD-10-CM | POA: Diagnosis not present

## 2015-08-23 DIAGNOSIS — Z79899 Other long term (current) drug therapy: Secondary | ICD-10-CM | POA: Diagnosis not present

## 2015-08-23 DIAGNOSIS — J3489 Other specified disorders of nose and nasal sinuses: Secondary | ICD-10-CM | POA: Diagnosis not present

## 2015-08-23 DIAGNOSIS — R109 Unspecified abdominal pain: Secondary | ICD-10-CM | POA: Diagnosis not present

## 2015-08-23 DIAGNOSIS — R319 Hematuria, unspecified: Secondary | ICD-10-CM | POA: Diagnosis not present

## 2015-08-23 DIAGNOSIS — Z8546 Personal history of malignant neoplasm of prostate: Secondary | ICD-10-CM | POA: Diagnosis not present

## 2015-08-23 DIAGNOSIS — Z9889 Other specified postprocedural states: Secondary | ICD-10-CM | POA: Diagnosis not present

## 2015-08-30 DIAGNOSIS — N393 Stress incontinence (female) (male): Secondary | ICD-10-CM | POA: Diagnosis not present

## 2015-08-30 DIAGNOSIS — C61 Malignant neoplasm of prostate: Secondary | ICD-10-CM | POA: Diagnosis not present

## 2015-10-03 DIAGNOSIS — Z85820 Personal history of malignant melanoma of skin: Secondary | ICD-10-CM | POA: Diagnosis not present

## 2015-10-03 DIAGNOSIS — D225 Melanocytic nevi of trunk: Secondary | ICD-10-CM | POA: Diagnosis not present

## 2015-10-03 DIAGNOSIS — D044 Carcinoma in situ of skin of scalp and neck: Secondary | ICD-10-CM | POA: Diagnosis not present

## 2015-10-03 DIAGNOSIS — D485 Neoplasm of uncertain behavior of skin: Secondary | ICD-10-CM | POA: Diagnosis not present

## 2015-10-03 DIAGNOSIS — Z85828 Personal history of other malignant neoplasm of skin: Secondary | ICD-10-CM | POA: Diagnosis not present

## 2015-10-03 DIAGNOSIS — L570 Actinic keratosis: Secondary | ICD-10-CM | POA: Diagnosis not present

## 2015-10-03 DIAGNOSIS — X32XXXA Exposure to sunlight, initial encounter: Secondary | ICD-10-CM | POA: Diagnosis not present

## 2015-10-03 DIAGNOSIS — D2261 Melanocytic nevi of right upper limb, including shoulder: Secondary | ICD-10-CM | POA: Diagnosis not present

## 2015-10-14 DIAGNOSIS — C61 Malignant neoplasm of prostate: Secondary | ICD-10-CM | POA: Diagnosis not present

## 2015-10-30 DIAGNOSIS — E119 Type 2 diabetes mellitus without complications: Secondary | ICD-10-CM | POA: Diagnosis not present

## 2015-10-30 DIAGNOSIS — M5093 Cervical disc disorder, unspecified, cervicothoracic region: Secondary | ICD-10-CM | POA: Diagnosis not present

## 2015-10-30 DIAGNOSIS — M5430 Sciatica, unspecified side: Secondary | ICD-10-CM | POA: Diagnosis not present

## 2015-10-30 DIAGNOSIS — I10 Essential (primary) hypertension: Secondary | ICD-10-CM | POA: Diagnosis not present

## 2015-11-28 DIAGNOSIS — I10 Essential (primary) hypertension: Secondary | ICD-10-CM | POA: Diagnosis not present

## 2015-11-28 DIAGNOSIS — M5430 Sciatica, unspecified side: Secondary | ICD-10-CM | POA: Diagnosis not present

## 2015-11-28 DIAGNOSIS — E119 Type 2 diabetes mellitus without complications: Secondary | ICD-10-CM | POA: Diagnosis not present

## 2015-12-04 DIAGNOSIS — D044 Carcinoma in situ of skin of scalp and neck: Secondary | ICD-10-CM | POA: Diagnosis not present

## 2015-12-04 DIAGNOSIS — L905 Scar conditions and fibrosis of skin: Secondary | ICD-10-CM | POA: Diagnosis not present

## 2016-01-22 DIAGNOSIS — C61 Malignant neoplasm of prostate: Secondary | ICD-10-CM | POA: Diagnosis not present

## 2016-02-24 DIAGNOSIS — C61 Malignant neoplasm of prostate: Secondary | ICD-10-CM | POA: Diagnosis not present

## 2016-03-24 DIAGNOSIS — D0439 Carcinoma in situ of skin of other parts of face: Secondary | ICD-10-CM | POA: Diagnosis not present

## 2016-03-24 DIAGNOSIS — Z85820 Personal history of malignant melanoma of skin: Secondary | ICD-10-CM | POA: Diagnosis not present

## 2016-03-24 DIAGNOSIS — X32XXXA Exposure to sunlight, initial encounter: Secondary | ICD-10-CM | POA: Diagnosis not present

## 2016-03-24 DIAGNOSIS — Z08 Encounter for follow-up examination after completed treatment for malignant neoplasm: Secondary | ICD-10-CM | POA: Diagnosis not present

## 2016-03-24 DIAGNOSIS — Z85828 Personal history of other malignant neoplasm of skin: Secondary | ICD-10-CM | POA: Diagnosis not present

## 2016-03-24 DIAGNOSIS — L570 Actinic keratosis: Secondary | ICD-10-CM | POA: Diagnosis not present

## 2016-03-24 DIAGNOSIS — D485 Neoplasm of uncertain behavior of skin: Secondary | ICD-10-CM | POA: Diagnosis not present

## 2016-03-24 DIAGNOSIS — L281 Prurigo nodularis: Secondary | ICD-10-CM | POA: Diagnosis not present

## 2016-03-24 DIAGNOSIS — C44629 Squamous cell carcinoma of skin of left upper limb, including shoulder: Secondary | ICD-10-CM | POA: Diagnosis not present

## 2016-04-21 DIAGNOSIS — E119 Type 2 diabetes mellitus without complications: Secondary | ICD-10-CM | POA: Diagnosis not present

## 2016-04-21 DIAGNOSIS — M5430 Sciatica, unspecified side: Secondary | ICD-10-CM | POA: Diagnosis not present

## 2016-04-21 DIAGNOSIS — M5093 Cervical disc disorder, unspecified, cervicothoracic region: Secondary | ICD-10-CM | POA: Diagnosis not present

## 2016-04-21 DIAGNOSIS — I10 Essential (primary) hypertension: Secondary | ICD-10-CM | POA: Diagnosis not present

## 2016-04-22 ENCOUNTER — Other Ambulatory Visit: Payer: Self-pay | Admitting: Internal Medicine

## 2016-04-22 DIAGNOSIS — R109 Unspecified abdominal pain: Secondary | ICD-10-CM

## 2016-04-27 DIAGNOSIS — E119 Type 2 diabetes mellitus without complications: Secondary | ICD-10-CM | POA: Diagnosis not present

## 2016-04-27 DIAGNOSIS — I253 Aneurysm of heart: Secondary | ICD-10-CM | POA: Diagnosis not present

## 2016-04-27 DIAGNOSIS — M5430 Sciatica, unspecified side: Secondary | ICD-10-CM | POA: Diagnosis not present

## 2016-04-27 DIAGNOSIS — M5093 Cervical disc disorder, unspecified, cervicothoracic region: Secondary | ICD-10-CM | POA: Diagnosis not present

## 2016-04-27 DIAGNOSIS — I10 Essential (primary) hypertension: Secondary | ICD-10-CM | POA: Diagnosis not present

## 2016-04-27 DIAGNOSIS — R0602 Shortness of breath: Secondary | ICD-10-CM | POA: Diagnosis not present

## 2016-04-29 ENCOUNTER — Ambulatory Visit: Payer: Medicare Other

## 2016-04-29 DIAGNOSIS — C44629 Squamous cell carcinoma of skin of left upper limb, including shoulder: Secondary | ICD-10-CM | POA: Diagnosis not present

## 2016-05-01 ENCOUNTER — Ambulatory Visit
Admission: RE | Admit: 2016-05-01 | Discharge: 2016-05-01 | Disposition: A | Discharge status: Home / Self Care | Payer: Medicare Other | Source: Ambulatory Visit | Attending: Internal Medicine | Admitting: Internal Medicine

## 2016-05-01 DIAGNOSIS — K760 Fatty (change of) liver, not elsewhere classified: Secondary | ICD-10-CM | POA: Diagnosis not present

## 2016-05-01 DIAGNOSIS — N281 Cyst of kidney, acquired: Secondary | ICD-10-CM | POA: Diagnosis not present

## 2016-05-01 DIAGNOSIS — R109 Unspecified abdominal pain: Secondary | ICD-10-CM | POA: Diagnosis not present

## 2016-05-25 DIAGNOSIS — C61 Malignant neoplasm of prostate: Secondary | ICD-10-CM | POA: Diagnosis not present

## 2016-06-01 DIAGNOSIS — M5093 Cervical disc disorder, unspecified, cervicothoracic region: Secondary | ICD-10-CM | POA: Diagnosis not present

## 2016-06-01 DIAGNOSIS — I10 Essential (primary) hypertension: Secondary | ICD-10-CM | POA: Diagnosis not present

## 2016-06-01 DIAGNOSIS — M5430 Sciatica, unspecified side: Secondary | ICD-10-CM | POA: Diagnosis not present

## 2016-06-01 DIAGNOSIS — E119 Type 2 diabetes mellitus without complications: Secondary | ICD-10-CM | POA: Diagnosis not present

## 2016-06-15 DIAGNOSIS — Z23 Encounter for immunization: Secondary | ICD-10-CM | POA: Diagnosis not present

## 2016-07-17 DIAGNOSIS — S73109A Unspecified sprain of unspecified hip, initial encounter: Secondary | ICD-10-CM | POA: Diagnosis not present

## 2016-07-17 DIAGNOSIS — S7000XA Contusion of unspecified hip, initial encounter: Secondary | ICD-10-CM | POA: Diagnosis not present

## 2016-07-17 DIAGNOSIS — E669 Obesity, unspecified: Secondary | ICD-10-CM | POA: Diagnosis not present

## 2016-07-17 DIAGNOSIS — M5430 Sciatica, unspecified side: Secondary | ICD-10-CM | POA: Diagnosis not present

## 2016-08-21 DIAGNOSIS — X32XXXA Exposure to sunlight, initial encounter: Secondary | ICD-10-CM | POA: Diagnosis not present

## 2016-08-21 DIAGNOSIS — Z85828 Personal history of other malignant neoplasm of skin: Secondary | ICD-10-CM | POA: Diagnosis not present

## 2016-08-21 DIAGNOSIS — L570 Actinic keratosis: Secondary | ICD-10-CM | POA: Diagnosis not present

## 2016-08-21 DIAGNOSIS — D485 Neoplasm of uncertain behavior of skin: Secondary | ICD-10-CM | POA: Diagnosis not present

## 2016-08-21 DIAGNOSIS — D0439 Carcinoma in situ of skin of other parts of face: Secondary | ICD-10-CM | POA: Diagnosis not present

## 2016-08-21 DIAGNOSIS — Z85820 Personal history of malignant melanoma of skin: Secondary | ICD-10-CM | POA: Diagnosis not present

## 2016-08-21 DIAGNOSIS — C44219 Basal cell carcinoma of skin of left ear and external auricular canal: Secondary | ICD-10-CM | POA: Diagnosis not present

## 2016-08-21 DIAGNOSIS — Z08 Encounter for follow-up examination after completed treatment for malignant neoplasm: Secondary | ICD-10-CM | POA: Diagnosis not present

## 2016-08-31 DIAGNOSIS — C61 Malignant neoplasm of prostate: Secondary | ICD-10-CM | POA: Diagnosis not present

## 2016-08-31 DIAGNOSIS — R5383 Other fatigue: Secondary | ICD-10-CM | POA: Diagnosis not present

## 2016-09-22 DIAGNOSIS — S7000XA Contusion of unspecified hip, initial encounter: Secondary | ICD-10-CM | POA: Diagnosis not present

## 2016-09-22 DIAGNOSIS — E669 Obesity, unspecified: Secondary | ICD-10-CM | POA: Diagnosis not present

## 2016-09-22 DIAGNOSIS — G933 Postviral fatigue syndrome: Secondary | ICD-10-CM | POA: Diagnosis not present

## 2016-09-22 DIAGNOSIS — S73109A Unspecified sprain of unspecified hip, initial encounter: Secondary | ICD-10-CM | POA: Diagnosis not present

## 2016-09-30 DIAGNOSIS — C44219 Basal cell carcinoma of skin of left ear and external auricular canal: Secondary | ICD-10-CM | POA: Diagnosis not present

## 2016-10-13 DIAGNOSIS — T814XXA Infection following a procedure, initial encounter: Secondary | ICD-10-CM | POA: Diagnosis not present

## 2017-01-17 IMAGING — MR MR PROSTATE WO/W CM
35 of 46 series · 37 of 48 positions shown · IV contrast (multihance)
Comparison: None.

CLINICAL DATA: Rising PSA.  Negative prostate biopsy August 2014.

EXAM:
MR PROSTATE WITHOUT AND WITH CONTRAST
TECHNIQUE: Multiplanar multisequence MRI images were obtained of the pelvis
centered about the prostate. Pre and post contrast images were
obtained.
CONTRAST:  20mL MULTIHANCE GADOBENATE DIMEGLUMINE 529 MG/ML IV SOLN

[Series 3: STIR · coronal · 5.0mm · 0.78mm/px · 1 of 32 slices shown]
[im 1/32]
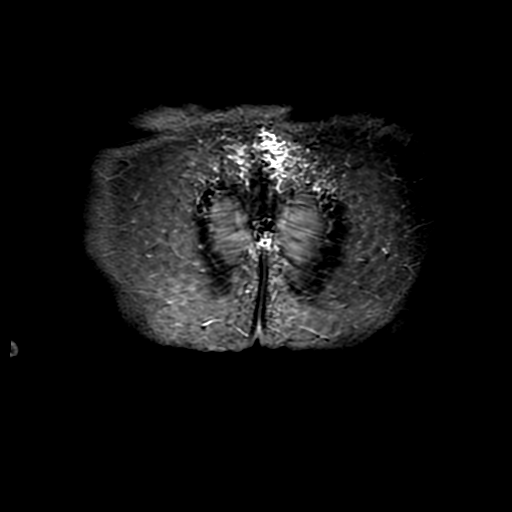

[Series 4: T1 · axial · 8.0mm · 0.78mm/px · 1 of 26 slices shown (1 of 2)]
[im 1/26]
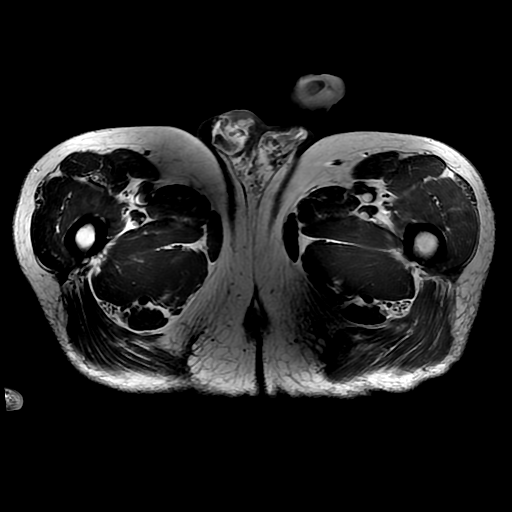

[Series 5: T2 · sagittal · 3.5mm · 0.35mm/px · 1 of 30 slices shown (1 of 5)]
[im 1/30]
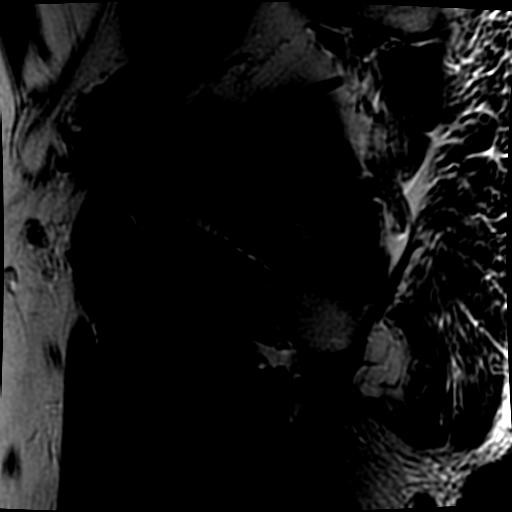

[Series 6: bSSFP fat-sat · axial · 8.0mm · 0.78mm/px · 1 of 26 slices shown]
[im 1/26]
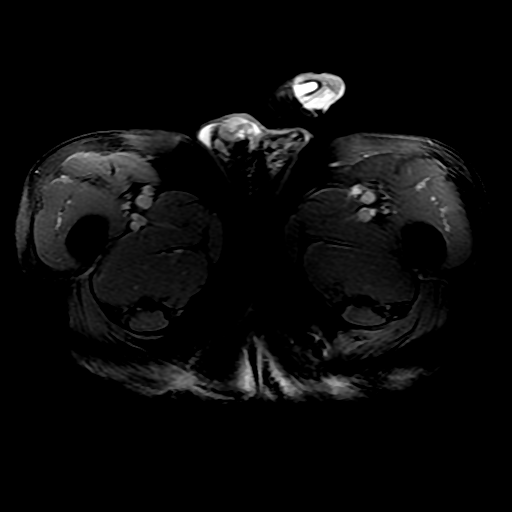

[Series 8: DWI · axial · 5.0mm · 1.56mm/px · 1 of 100 slices shown (1 of 4)]
[im 1/100]
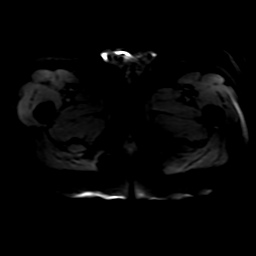

[Series 9: T1 · axial · 3.5mm · 0.70mm/px · 1 of 22 slices shown (2 of 2)]
[im 1/22]
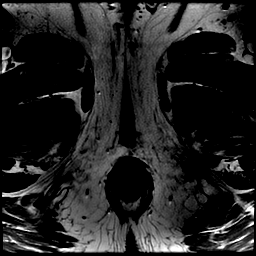

[Series 10: T2 · axial · 3.5mm · 0.35mm/px · 1 of 22 slices shown (2 of 5)]
[im 1/22]
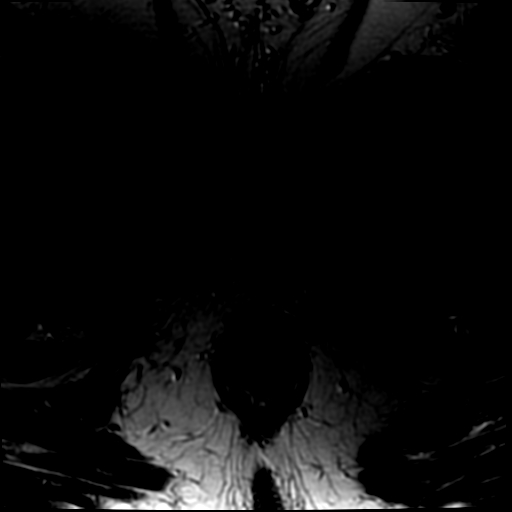

[Series 12: T2 · coronal · 3.5mm · 0.35mm/px · 1 of 24 slices shown (3 of 5)]
[im 1/24]
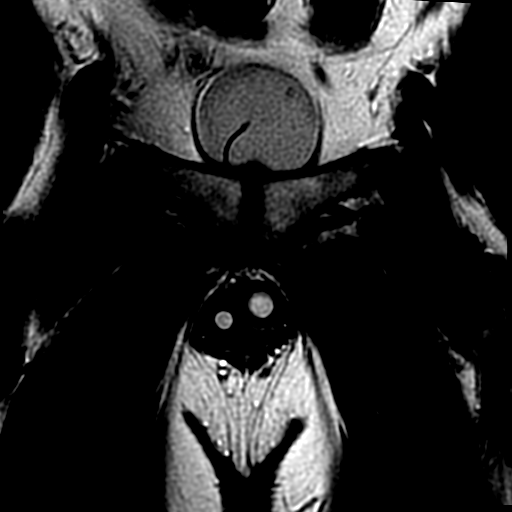

[Series 13: DWI · axial · 3.5mm · 0.70mm/px · 1 of 90 slices shown (2 of 4)]
[im 1/90]
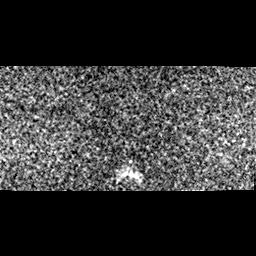

[Series 14: T2 · axial · 1.5mm · 0.35mm/px · z∈[-67,+55]mm · 2 of 82 slices shown (4 of 5)]
[im 1/82]
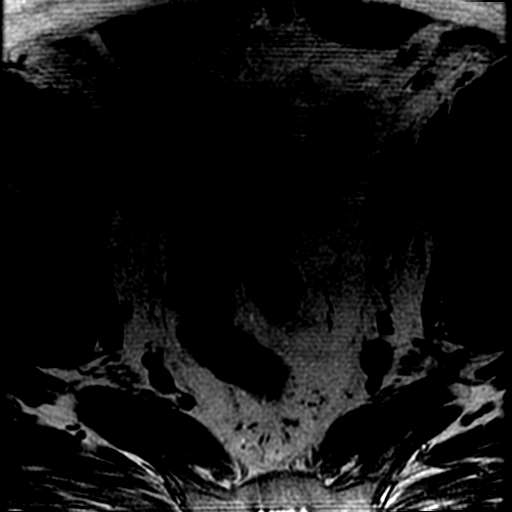
[im 82/82]
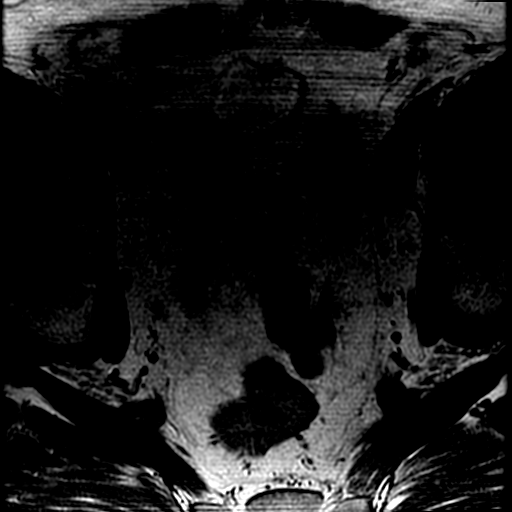

[Series 800: DWI · axial · 5.0mm · 1.56mm/px · 1 of 50 slices shown (3 of 4)]
[im 1/50]
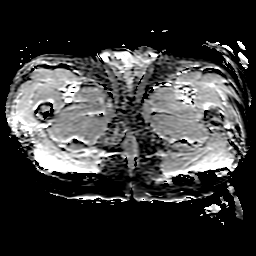

[Series 1300: DWI · axial · 3.5mm · 0.70mm/px · 1 of 7 slices shown (4 of 4)]
[im 1/7]
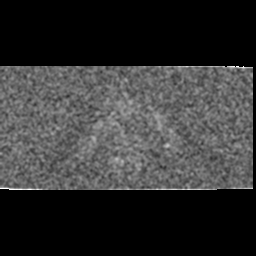

[Series 1400: T2 · axial · 1.5mm · 0.35mm/px · z∈[-67,+55]mm · 2 of 82 slices shown (5 of 5)]
[im 1/82]
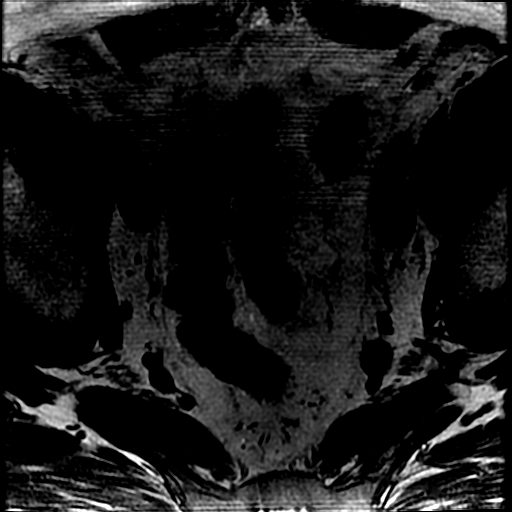
[im 82/82]
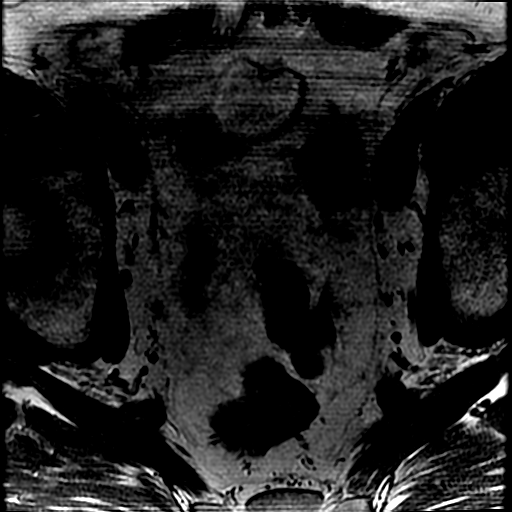

[Series 1500: T1 dynamic · axial · 3.0mm · 0.89mm/px · 1 of 30 slices shown (1 of 22)]
[im 1/30]
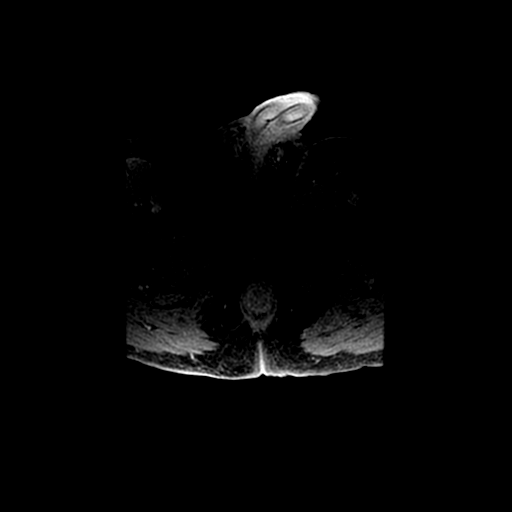

[Series 1600: T1 dynamic · axial · 3.0mm · 1.31mm/px · 1 of 30 slices shown (2 of 22)]
[im 1/30]
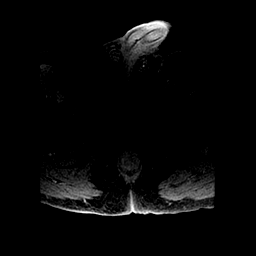

[Series 1601: T1 dynamic · axial · 3.0mm · 1.31mm/px · 1 of 30 slices shown (3 of 22)]
[im 1/30]
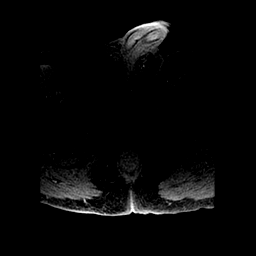

[Series 1602: T1 dynamic · axial · 3.0mm · 1.31mm/px · 1 of 30 slices shown (4 of 22)]
[im 1/30]
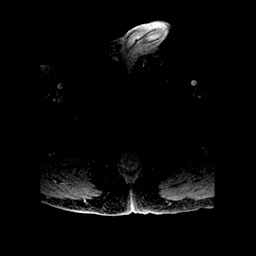

[Series 1603: T1 dynamic · axial · 3.0mm · 1.31mm/px · 1 of 30 slices shown (5 of 22)]
[im 1/30]
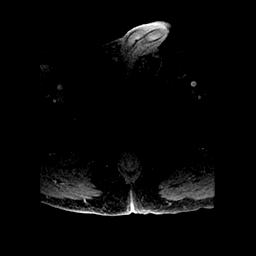

[Series 1604: T1 dynamic · axial · 3.0mm · 1.31mm/px · 1 of 30 slices shown (6 of 22)]
[im 1/30]
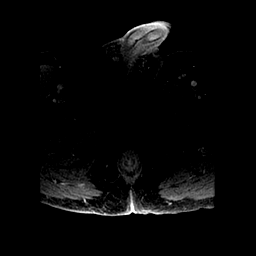

[Series 1605: T1 dynamic · axial · 3.0mm · 1.31mm/px · 1 of 30 slices shown (7 of 22)]
[im 1/30]
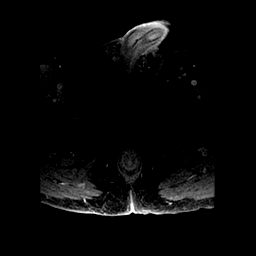

[Series 1606: T1 dynamic · axial · 3.0mm · 1.31mm/px · 1 of 30 slices shown (8 of 22)]
[im 1/30]
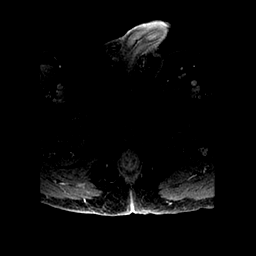

[Series 1607: T1 dynamic · axial · 3.0mm · 1.31mm/px · 1 of 30 slices shown (9 of 22)]
[im 1/30]
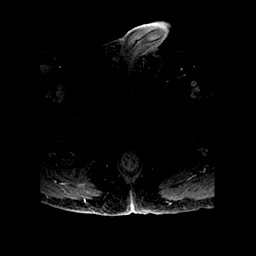

[Series 1608: T1 dynamic · axial · 3.0mm · 1.31mm/px · 1 of 30 slices shown (10 of 22)]
[im 1/30]
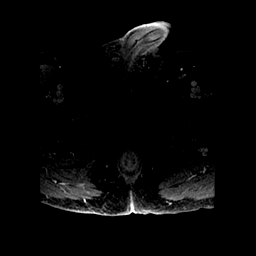

[Series 1609: T1 dynamic · axial · 3.0mm · 1.31mm/px · 1 of 30 slices shown (11 of 22)]
[im 1/30]
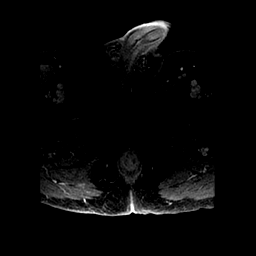

[Series 1610: T1 dynamic · axial · 3.0mm · 1.31mm/px · 1 of 30 slices shown (12 of 22)]
[im 1/30]
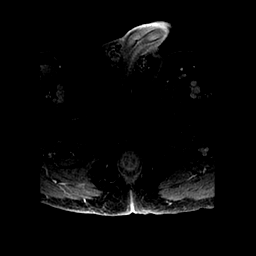

[Series 1611: T1 dynamic · axial · 3.0mm · 1.31mm/px · 1 of 30 slices shown (13 of 22)]
[im 1/30]
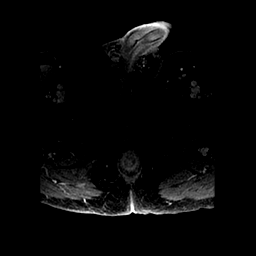

[Series 1612: T1 dynamic · axial · 3.0mm · 1.31mm/px · 1 of 30 slices shown (14 of 22)]
[im 1/30]
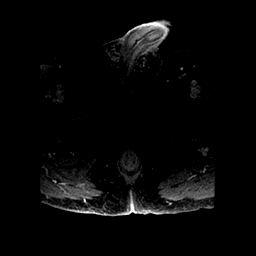

[Series 1613: T1 dynamic · axial · 3.0mm · 1.31mm/px · 1 of 30 slices shown (15 of 22)]
[im 1/30]
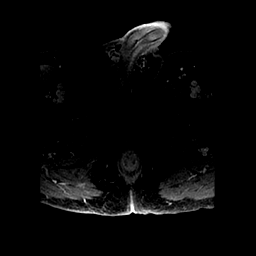

[Series 1614: T1 dynamic · axial · 3.0mm · 1.31mm/px · 1 of 30 slices shown (16 of 22)]
[im 1/30]
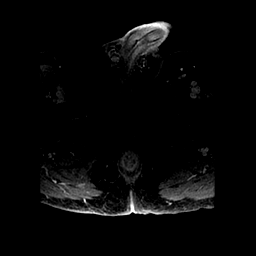

[Series 1615: T1 dynamic · axial · 3.0mm · 1.31mm/px · 1 of 30 slices shown (17 of 22)]
[im 1/30]
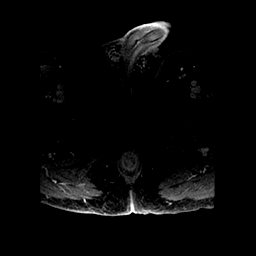

[Series 1616: T1 dynamic · axial · 3.0mm · 1.31mm/px · 1 of 30 slices shown (18 of 22)]
[im 1/30]
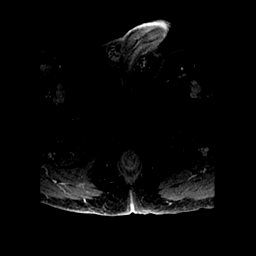

[Series 1617: T1 dynamic · axial · 3.0mm · 1.31mm/px · 1 of 30 slices shown (19 of 22)]
[im 1/30]
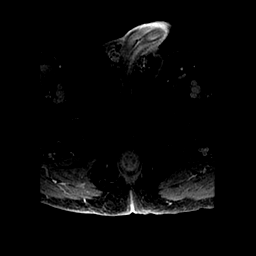

[Series 1618: T1 dynamic · axial · 3.0mm · 1.31mm/px · 1 of 30 slices shown (20 of 22)]
[im 1/30]
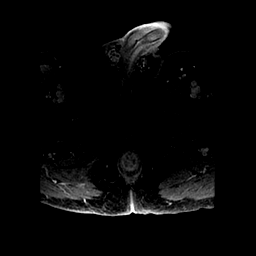

[Series 1619: T1 dynamic · axial · 3.0mm · 1.31mm/px · 1 of 30 slices shown (21 of 22)]
[im 1/30]
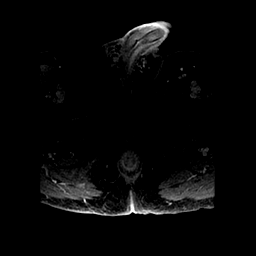

[Series 1620: T1 dynamic · axial · 3.0mm · 1.31mm/px · 1 of 30 slices shown (22 of 22)]
[im 1/30]
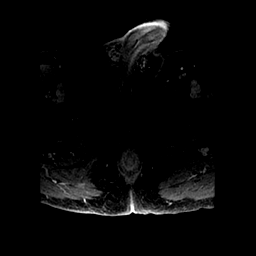

[37 of 48 positions shown; findings below may reference images not displayed]

FINDINGS: Prostate: Demonstrates an approximately 14 x 8 mm area of T2 hypo
intensity involving the anterior aspect of the left mid to apical
gland. This is positioned at the junction of the peripheral zone and
central gland. Examples include on image/series [DATE], [DATE], [DATE],
and [DATE]. This demonstrates significantly restricted diffusion on
image/series [DATE]. Early post-contrast enhancement, including on
image/series [DATE].

Transcapsular spread:  Absent

Seminal vesicle involvement: Absent

Neurovascular bundle involvement: Absent

Pelvic adenopathy: Absent

Bone metastasis: Absent

Other findings: Colonic diverticulosis. Fat containing right
inguinal hernia. Normal urinary bladder. Penile prosthesis. No
significant free fluid.
IMPRESSION: 1. Single abnormality within the anterior left mid to apical gland,
along the junction of the peripheral zone and central gland. This is
suspicious for carcinoma, either within the peripheral zone or
arising from the central gland and impressing into the peripheral
zone. Given presence of restricted diffusion and early post-contrast
enhancement, higher grade disease is suspected (PI-RADS category 4).
Isolated benign prostatic hyperplasia nodule is possible but felt
less likely.
2.  No evidence of locally advanced or pelvic metastatic disease.

## 2017-01-26 DIAGNOSIS — I10 Essential (primary) hypertension: Secondary | ICD-10-CM | POA: Diagnosis not present

## 2017-01-26 DIAGNOSIS — M5430 Sciatica, unspecified side: Secondary | ICD-10-CM | POA: Diagnosis not present

## 2017-01-26 DIAGNOSIS — M5093 Cervical disc disorder, unspecified, cervicothoracic region: Secondary | ICD-10-CM | POA: Diagnosis not present

## 2017-01-26 DIAGNOSIS — M4800 Spinal stenosis, site unspecified: Secondary | ICD-10-CM | POA: Diagnosis not present

## 2017-02-23 DIAGNOSIS — E113293 Type 2 diabetes mellitus with mild nonproliferative diabetic retinopathy without macular edema, bilateral: Secondary | ICD-10-CM | POA: Diagnosis not present

## 2017-02-23 DIAGNOSIS — H3562 Retinal hemorrhage, left eye: Secondary | ICD-10-CM | POA: Diagnosis not present

## 2017-02-23 DIAGNOSIS — H524 Presbyopia: Secondary | ICD-10-CM | POA: Diagnosis not present

## 2017-02-23 DIAGNOSIS — H547 Unspecified visual loss: Secondary | ICD-10-CM | POA: Diagnosis not present

## 2017-03-08 DIAGNOSIS — C61 Malignant neoplasm of prostate: Secondary | ICD-10-CM | POA: Diagnosis not present

## 2017-04-26 DIAGNOSIS — L570 Actinic keratosis: Secondary | ICD-10-CM | POA: Diagnosis not present

## 2017-04-26 DIAGNOSIS — C44519 Basal cell carcinoma of skin of other part of trunk: Secondary | ICD-10-CM | POA: Diagnosis not present

## 2017-04-26 DIAGNOSIS — Z85820 Personal history of malignant melanoma of skin: Secondary | ICD-10-CM | POA: Diagnosis not present

## 2017-04-26 DIAGNOSIS — D485 Neoplasm of uncertain behavior of skin: Secondary | ICD-10-CM | POA: Diagnosis not present

## 2017-04-26 DIAGNOSIS — D225 Melanocytic nevi of trunk: Secondary | ICD-10-CM | POA: Diagnosis not present

## 2017-04-26 DIAGNOSIS — D2262 Melanocytic nevi of left upper limb, including shoulder: Secondary | ICD-10-CM | POA: Diagnosis not present

## 2017-04-26 DIAGNOSIS — L281 Prurigo nodularis: Secondary | ICD-10-CM | POA: Diagnosis not present

## 2017-04-26 DIAGNOSIS — X32XXXA Exposure to sunlight, initial encounter: Secondary | ICD-10-CM | POA: Diagnosis not present

## 2017-04-26 DIAGNOSIS — Z85828 Personal history of other malignant neoplasm of skin: Secondary | ICD-10-CM | POA: Diagnosis not present

## 2017-04-26 DIAGNOSIS — D0461 Carcinoma in situ of skin of right upper limb, including shoulder: Secondary | ICD-10-CM | POA: Diagnosis not present

## 2017-04-26 DIAGNOSIS — C4441 Basal cell carcinoma of skin of scalp and neck: Secondary | ICD-10-CM | POA: Diagnosis not present

## 2017-05-03 DIAGNOSIS — M5430 Sciatica, unspecified side: Secondary | ICD-10-CM | POA: Diagnosis not present

## 2017-05-03 DIAGNOSIS — I10 Essential (primary) hypertension: Secondary | ICD-10-CM | POA: Diagnosis not present

## 2017-05-03 DIAGNOSIS — M4800 Spinal stenosis, site unspecified: Secondary | ICD-10-CM | POA: Diagnosis not present

## 2017-05-03 DIAGNOSIS — Z23 Encounter for immunization: Secondary | ICD-10-CM | POA: Diagnosis not present

## 2017-05-03 DIAGNOSIS — M5093 Cervical disc disorder, unspecified, cervicothoracic region: Secondary | ICD-10-CM | POA: Diagnosis not present

## 2017-05-04 DIAGNOSIS — L905 Scar conditions and fibrosis of skin: Secondary | ICD-10-CM | POA: Diagnosis not present

## 2017-05-04 DIAGNOSIS — C4441 Basal cell carcinoma of skin of scalp and neck: Secondary | ICD-10-CM | POA: Diagnosis not present

## 2017-07-01 DIAGNOSIS — J439 Emphysema, unspecified: Secondary | ICD-10-CM | POA: Diagnosis not present

## 2017-07-01 DIAGNOSIS — M5093 Cervical disc disorder, unspecified, cervicothoracic region: Secondary | ICD-10-CM | POA: Diagnosis not present

## 2017-07-01 DIAGNOSIS — M5430 Sciatica, unspecified side: Secondary | ICD-10-CM | POA: Diagnosis not present

## 2017-07-01 DIAGNOSIS — J219 Acute bronchiolitis, unspecified: Secondary | ICD-10-CM | POA: Diagnosis not present

## 2017-08-06 DIAGNOSIS — D0461 Carcinoma in situ of skin of right upper limb, including shoulder: Secondary | ICD-10-CM | POA: Diagnosis not present

## 2017-08-06 DIAGNOSIS — C44519 Basal cell carcinoma of skin of other part of trunk: Secondary | ICD-10-CM | POA: Diagnosis not present

## 2017-08-08 DIAGNOSIS — J209 Acute bronchitis, unspecified: Secondary | ICD-10-CM | POA: Diagnosis not present

## 2017-08-24 DIAGNOSIS — J399 Disease of upper respiratory tract, unspecified: Secondary | ICD-10-CM | POA: Diagnosis not present

## 2017-08-24 DIAGNOSIS — K219 Gastro-esophageal reflux disease without esophagitis: Secondary | ICD-10-CM | POA: Diagnosis not present

## 2017-08-24 DIAGNOSIS — E119 Type 2 diabetes mellitus without complications: Secondary | ICD-10-CM | POA: Diagnosis not present

## 2017-08-24 DIAGNOSIS — E669 Obesity, unspecified: Secondary | ICD-10-CM | POA: Diagnosis not present

## 2017-11-04 ENCOUNTER — Ambulatory Visit
Admission: RE | Admit: 2017-11-04 | Discharge: 2017-11-04 | Disposition: A | Discharge status: Home / Self Care | Payer: Medicare Other | Source: Ambulatory Visit | Attending: Internal Medicine | Admitting: Internal Medicine

## 2017-11-04 ENCOUNTER — Other Ambulatory Visit: Payer: Self-pay | Admitting: Internal Medicine

## 2017-11-04 DIAGNOSIS — J189 Pneumonia, unspecified organism: Secondary | ICD-10-CM

## 2017-11-04 DIAGNOSIS — J029 Acute pharyngitis, unspecified: Secondary | ICD-10-CM | POA: Diagnosis not present

## 2017-11-04 DIAGNOSIS — E669 Obesity, unspecified: Secondary | ICD-10-CM | POA: Diagnosis not present

## 2017-11-04 DIAGNOSIS — J181 Lobar pneumonia, unspecified organism: Principal | ICD-10-CM

## 2017-11-04 DIAGNOSIS — M4800 Spinal stenosis, site unspecified: Secondary | ICD-10-CM | POA: Diagnosis not present

## 2017-11-04 DIAGNOSIS — M5093 Cervical disc disorder, unspecified, cervicothoracic region: Secondary | ICD-10-CM | POA: Diagnosis not present

## 2017-11-04 DIAGNOSIS — M5430 Sciatica, unspecified side: Secondary | ICD-10-CM | POA: Diagnosis not present

## 2017-11-04 DIAGNOSIS — R0989 Other specified symptoms and signs involving the circulatory and respiratory systems: Secondary | ICD-10-CM | POA: Diagnosis not present

## 2017-11-08 DIAGNOSIS — M5412 Radiculopathy, cervical region: Secondary | ICD-10-CM | POA: Diagnosis not present

## 2017-11-08 DIAGNOSIS — M5430 Sciatica, unspecified side: Secondary | ICD-10-CM | POA: Diagnosis not present

## 2017-11-08 DIAGNOSIS — M5382 Other specified dorsopathies, cervical region: Secondary | ICD-10-CM | POA: Diagnosis not present

## 2017-11-08 DIAGNOSIS — M4800 Spinal stenosis, site unspecified: Secondary | ICD-10-CM | POA: Diagnosis not present

## 2018-01-17 DIAGNOSIS — M4800 Spinal stenosis, site unspecified: Secondary | ICD-10-CM | POA: Diagnosis not present

## 2018-01-17 DIAGNOSIS — M5382 Other specified dorsopathies, cervical region: Secondary | ICD-10-CM | POA: Diagnosis not present

## 2018-01-17 DIAGNOSIS — M5412 Radiculopathy, cervical region: Secondary | ICD-10-CM | POA: Diagnosis not present

## 2018-01-17 DIAGNOSIS — M5430 Sciatica, unspecified side: Secondary | ICD-10-CM | POA: Diagnosis not present

## 2018-02-24 DIAGNOSIS — C61 Malignant neoplasm of prostate: Secondary | ICD-10-CM | POA: Diagnosis not present

## 2018-03-03 ENCOUNTER — Other Ambulatory Visit: Payer: Self-pay

## 2018-04-11 DIAGNOSIS — M5412 Radiculopathy, cervical region: Secondary | ICD-10-CM | POA: Diagnosis not present

## 2018-04-11 DIAGNOSIS — M4800 Spinal stenosis, site unspecified: Secondary | ICD-10-CM | POA: Diagnosis not present

## 2018-04-11 DIAGNOSIS — M5430 Sciatica, unspecified side: Secondary | ICD-10-CM | POA: Diagnosis not present

## 2018-04-11 DIAGNOSIS — Z0000 Encounter for general adult medical examination without abnormal findings: Secondary | ICD-10-CM | POA: Diagnosis not present

## 2018-04-11 DIAGNOSIS — M5382 Other specified dorsopathies, cervical region: Secondary | ICD-10-CM | POA: Diagnosis not present

## 2018-04-28 DIAGNOSIS — Z125 Encounter for screening for malignant neoplasm of prostate: Secondary | ICD-10-CM | POA: Diagnosis not present

## 2018-04-28 DIAGNOSIS — E119 Type 2 diabetes mellitus without complications: Secondary | ICD-10-CM | POA: Diagnosis not present

## 2018-04-28 DIAGNOSIS — E034 Atrophy of thyroid (acquired): Secondary | ICD-10-CM | POA: Diagnosis not present

## 2018-04-28 DIAGNOSIS — G894 Chronic pain syndrome: Secondary | ICD-10-CM | POA: Diagnosis not present

## 2018-04-28 DIAGNOSIS — I10 Essential (primary) hypertension: Secondary | ICD-10-CM | POA: Diagnosis not present

## 2018-04-28 DIAGNOSIS — R5381 Other malaise: Secondary | ICD-10-CM | POA: Diagnosis not present

## 2018-04-28 DIAGNOSIS — E7849 Other hyperlipidemia: Secondary | ICD-10-CM | POA: Diagnosis not present

## 2018-05-11 DIAGNOSIS — E1129 Type 2 diabetes mellitus with other diabetic kidney complication: Secondary | ICD-10-CM | POA: Diagnosis not present

## 2018-05-11 DIAGNOSIS — Z23 Encounter for immunization: Secondary | ICD-10-CM | POA: Diagnosis not present

## 2018-05-11 DIAGNOSIS — I119 Hypertensive heart disease without heart failure: Secondary | ICD-10-CM | POA: Diagnosis not present

## 2018-05-11 DIAGNOSIS — N182 Chronic kidney disease, stage 2 (mild): Secondary | ICD-10-CM | POA: Diagnosis not present

## 2018-05-11 DIAGNOSIS — E1121 Type 2 diabetes mellitus with diabetic nephropathy: Secondary | ICD-10-CM | POA: Diagnosis not present

## 2018-05-18 DIAGNOSIS — R12 Heartburn: Secondary | ICD-10-CM | POA: Diagnosis not present

## 2018-05-18 DIAGNOSIS — E1121 Type 2 diabetes mellitus with diabetic nephropathy: Secondary | ICD-10-CM | POA: Diagnosis not present

## 2018-05-18 DIAGNOSIS — N182 Chronic kidney disease, stage 2 (mild): Secondary | ICD-10-CM | POA: Diagnosis not present

## 2018-05-18 DIAGNOSIS — S73109A Unspecified sprain of unspecified hip, initial encounter: Secondary | ICD-10-CM | POA: Diagnosis not present

## 2018-05-20 ENCOUNTER — Other Ambulatory Visit: Payer: Self-pay | Admitting: Internal Medicine

## 2018-05-20 DIAGNOSIS — R1084 Generalized abdominal pain: Secondary | ICD-10-CM

## 2018-05-25 ENCOUNTER — Ambulatory Visit
Admission: RE | Admit: 2018-05-25 | Discharge: 2018-05-25 | Disposition: A | Discharge status: Home / Self Care | Payer: Medicare Other | Source: Ambulatory Visit | Attending: Internal Medicine | Admitting: Internal Medicine

## 2018-05-25 DIAGNOSIS — R109 Unspecified abdominal pain: Secondary | ICD-10-CM | POA: Diagnosis not present

## 2018-05-25 DIAGNOSIS — R1084 Generalized abdominal pain: Secondary | ICD-10-CM

## 2018-05-25 DIAGNOSIS — N281 Cyst of kidney, acquired: Secondary | ICD-10-CM | POA: Diagnosis not present

## 2018-05-25 DIAGNOSIS — K760 Fatty (change of) liver, not elsewhere classified: Secondary | ICD-10-CM | POA: Diagnosis not present

## 2018-05-25 DIAGNOSIS — K219 Gastro-esophageal reflux disease without esophagitis: Secondary | ICD-10-CM | POA: Diagnosis not present

## 2018-06-01 DIAGNOSIS — E1121 Type 2 diabetes mellitus with diabetic nephropathy: Secondary | ICD-10-CM | POA: Diagnosis not present

## 2018-06-01 DIAGNOSIS — M5412 Radiculopathy, cervical region: Secondary | ICD-10-CM | POA: Diagnosis not present

## 2018-06-01 DIAGNOSIS — S73109A Unspecified sprain of unspecified hip, initial encounter: Secondary | ICD-10-CM | POA: Diagnosis not present

## 2018-06-01 DIAGNOSIS — N182 Chronic kidney disease, stage 2 (mild): Secondary | ICD-10-CM | POA: Diagnosis not present

## 2018-06-21 DIAGNOSIS — L570 Actinic keratosis: Secondary | ICD-10-CM | POA: Diagnosis not present

## 2018-06-21 DIAGNOSIS — Z08 Encounter for follow-up examination after completed treatment for malignant neoplasm: Secondary | ICD-10-CM | POA: Diagnosis not present

## 2018-06-21 DIAGNOSIS — D2261 Melanocytic nevi of right upper limb, including shoulder: Secondary | ICD-10-CM | POA: Diagnosis not present

## 2018-06-21 DIAGNOSIS — X32XXXA Exposure to sunlight, initial encounter: Secondary | ICD-10-CM | POA: Diagnosis not present

## 2018-06-21 DIAGNOSIS — Z85828 Personal history of other malignant neoplasm of skin: Secondary | ICD-10-CM | POA: Diagnosis not present

## 2018-06-21 DIAGNOSIS — Z85820 Personal history of malignant melanoma of skin: Secondary | ICD-10-CM | POA: Diagnosis not present

## 2018-06-21 DIAGNOSIS — K130 Diseases of lips: Secondary | ICD-10-CM | POA: Diagnosis not present

## 2018-06-21 DIAGNOSIS — D2262 Melanocytic nevi of left upper limb, including shoulder: Secondary | ICD-10-CM | POA: Diagnosis not present

## 2018-08-01 DIAGNOSIS — J40 Bronchitis, not specified as acute or chronic: Secondary | ICD-10-CM | POA: Diagnosis not present

## 2018-08-01 DIAGNOSIS — R52 Pain, unspecified: Secondary | ICD-10-CM | POA: Diagnosis not present

## 2018-08-01 DIAGNOSIS — Z20828 Contact with and (suspected) exposure to other viral communicable diseases: Secondary | ICD-10-CM | POA: Diagnosis not present

## 2018-08-01 DIAGNOSIS — J019 Acute sinusitis, unspecified: Secondary | ICD-10-CM | POA: Diagnosis not present

## 2018-08-01 DIAGNOSIS — B9689 Other specified bacterial agents as the cause of diseases classified elsewhere: Secondary | ICD-10-CM | POA: Diagnosis not present

## 2018-09-09 ENCOUNTER — Other Ambulatory Visit: Payer: Self-pay

## 2018-09-09 DIAGNOSIS — Z1211 Encounter for screening for malignant neoplasm of colon: Secondary | ICD-10-CM

## 2018-09-27 ENCOUNTER — Ambulatory Visit: Payer: Medicare Other | Admitting: Registered Nurse

## 2018-09-27 ENCOUNTER — Encounter: Payer: Self-pay | Admitting: *Deleted

## 2018-09-27 ENCOUNTER — Encounter
Admission: RE | Disposition: A | Discharge status: Home / Self Care | Payer: Self-pay | Source: Home / Self Care | Attending: Gastroenterology

## 2018-09-27 ENCOUNTER — Ambulatory Visit
Admission: RE | Admit: 2018-09-27 | Discharge: 2018-09-27 | Disposition: A | Discharge status: Home / Self Care | Payer: Medicare Other | Attending: Gastroenterology | Admitting: Gastroenterology

## 2018-09-27 DIAGNOSIS — Z1211 Encounter for screening for malignant neoplasm of colon: Secondary | ICD-10-CM | POA: Diagnosis not present

## 2018-09-27 DIAGNOSIS — Z885 Allergy status to narcotic agent status: Secondary | ICD-10-CM | POA: Insufficient documentation

## 2018-09-27 DIAGNOSIS — E119 Type 2 diabetes mellitus without complications: Secondary | ICD-10-CM | POA: Insufficient documentation

## 2018-09-27 DIAGNOSIS — K5730 Diverticulosis of large intestine without perforation or abscess without bleeding: Secondary | ICD-10-CM | POA: Insufficient documentation

## 2018-09-27 DIAGNOSIS — Z882 Allergy status to sulfonamides status: Secondary | ICD-10-CM | POA: Diagnosis not present

## 2018-09-27 DIAGNOSIS — Z860100 Personal history of colon polyps, unspecified: Secondary | ICD-10-CM

## 2018-09-27 DIAGNOSIS — G4730 Sleep apnea, unspecified: Secondary | ICD-10-CM | POA: Diagnosis not present

## 2018-09-27 DIAGNOSIS — D122 Benign neoplasm of ascending colon: Secondary | ICD-10-CM | POA: Diagnosis not present

## 2018-09-27 DIAGNOSIS — K640 First degree hemorrhoids: Secondary | ICD-10-CM | POA: Insufficient documentation

## 2018-09-27 DIAGNOSIS — Z8546 Personal history of malignant neoplasm of prostate: Secondary | ICD-10-CM | POA: Diagnosis not present

## 2018-09-27 HISTORY — PX: COLONOSCOPY WITH PROPOFOL: SHX5780

## 2018-09-27 HISTORY — DX: Sleep apnea, unspecified: G47.30

## 2018-09-27 LAB — GLUCOSE, CAPILLARY: Glucose-Capillary: 96 mg/dL (ref 70–99)

## 2018-09-27 SURGERY — COLONOSCOPY WITH PROPOFOL
Anesthesia: General

## 2018-09-27 MED ORDER — SODIUM CHLORIDE 0.9 % IV SOLN
INTRAVENOUS | Status: DC
  Administered 2018-09-27: 1000 mL via INTRAVENOUS

## 2018-09-27 MED ORDER — LIDOCAINE HCL (PF) 2 % IJ SOLN
INTRAMUSCULAR | Status: AC
  Filled 2018-09-27: qty 10

## 2018-09-27 MED ORDER — PROPOFOL 500 MG/50ML IV EMUL
INTRAVENOUS | Status: DC | PRN
  Administered 2018-09-27: 175 ug/kg/min via INTRAVENOUS

## 2018-09-27 MED ORDER — LIDOCAINE HCL (CARDIAC) PF 100 MG/5ML IV SOSY
PREFILLED_SYRINGE | INTRAVENOUS | Status: DC | PRN
  Administered 2018-09-27: 100 mg via INTRATRACHEAL

## 2018-09-27 MED ORDER — PROPOFOL 10 MG/ML IV BOLUS
INTRAVENOUS | Status: DC | PRN
  Administered 2018-09-27: 80 mg via INTRAVENOUS

## 2018-09-27 MED ORDER — PROPOFOL 500 MG/50ML IV EMUL
INTRAVENOUS | Status: AC
  Filled 2018-09-27: qty 50

## 2018-09-27 NOTE — Op Note (Signed)
[facility] Gastroenterology Patient Name: Michael Ibarra Procedure Date: 09/27/2018 10:18 AM MRN: [id number] Account #: [id number] Date of Birth: 07-12-1941 Admit Type: Outpatient Age: 78 Room: [facility] ENDO ROOM 4 Gender: Male Note Status: Finalized Procedure:            Colonoscopy Indications:          High risk colon cancer surveillance: Personal history                        of colonic polyps Providers:            Lucilla Lame MD, MD Referring MD:         Cletis Athens, MD (Referring MD) Medicines:            Propofol per Anesthesia Complications:        No immediate complications. Procedure:            Pre-Anesthesia Assessment:                       - Prior to the procedure, a History and Physical was                        performed, and patient medications and allergies were                        reviewed. The patient's tolerance of previous                        anesthesia was also reviewed. The risks and benefits of                        the procedure and the sedation options and risks were                        discussed with the patient. All questions were                        answered, and informed consent was obtained. Prior                        Anticoagulants: The patient has taken no previous                        anticoagulant or antiplatelet agents. ASA Grade                        Assessment: II - A patient with mild systemic disease.                        After reviewing the risks and benefits, the patient was                        deemed in satisfactory condition to undergo the                        procedure.                       After obtaining informed consent, the colonoscope was  passed under direct vision. Throughout the procedure,                        the patient's blood pressure, pulse, and oxygen                        saturations were monitored continuously. The                        Colonoscope was  introduced through the anus and                        advanced to the the cecum, identified by appendiceal                        orifice and ileocecal valve. The colonoscopy was                        performed without difficulty. The patient tolerated the                        procedure well. The quality of the bowel preparation                        was excellent. Findings:      The perianal and digital rectal examinations were normal.      A 3 mm polyp was found in the ascending colon. The polyp was sessile.       The polyp was removed with a cold biopsy forceps. Resection and       retrieval were complete.      Non-bleeding internal hemorrhoids were found during retroflexion. The       hemorrhoids were Grade I (internal hemorrhoids that do not prolapse).      Multiple small-mouthed diverticula were found in the sigmoid colon. Impression:           - One 3 mm polyp in the ascending colon, removed with a                        cold biopsy forceps. Resected and retrieved.                       - Non-bleeding internal hemorrhoids.                       - Diverticulosis in the sigmoid colon. Recommendation:       - Discharge patient to home.                       - Resume previous diet.                       - Continue present medications.                       - Await pathology results.                       - Repeat colonoscopy in 5 years for surveillance. Procedure Code(s):    --- Professional ---  45380, Colonoscopy, flexible; with biopsy, single or                        multiple Diagnosis Code(s):    --- Professional ---                       Z86.010, Personal history of colonic polyps                       D12.2, Benign neoplasm of ascending colon CPT copyright 2018 American Medical Association. All rights reserved. The codes documented in this report are preliminary and upon coder review may  be revised to meet current compliance requirements. Lucilla Lame MD, MD 09/27/2018 10:48:26 AM This report has been signed electronically. Number of Addenda: 0 Note Initiated On: 09/27/2018 10:18 AM Scope Withdrawal Time: 0 hours 7 minutes 14 seconds  Total Procedure Duration: 0 hours 12 minutes 9 seconds       [facility]

## 2018-09-27 NOTE — H&P (Signed)
Lucilla Lame, MD [address], [place] [address] [phone] Fax : [phone]  Primary Care Physician:  Cletis Athens, MD Primary Gastroenterologist:  Dr. Allen Norris  Pre-Procedure History & Physical: HPI:  Michael Ibarra is a 78 y.o. male is here for an colonoscopy.   Past Medical History:  Diagnosis Date  . Cancer [facility])    prostate  . Diabetes mellitus without complication ([place])   . Sleep apnea     Past Surgical History:  Procedure Laterality Date  . BACK SURGERY    . PROSTECTOMY      Prior to Admission medications   Not on File    Allergies as of 09/09/2018 - Review Complete 06/21/2015  Allergen Reaction Noted  . Morphine and related Other (See Comments) 08/24/2011  . Sulfa antibiotics Other (See Comments) 08/24/2011    History reviewed. No pertinent family history.  Social History   Socioeconomic History  . Marital status: Married    Spouse name: Not on file  . Number of children: Not on file  . Years of education: Not on file  . Highest education level: Not on file  Occupational History  . Not on file  Social Needs  . Financial resource strain: Not on file  . Food insecurity:    Worry: Not on file    Inability: Not on file  . Transportation needs:    Medical: Not on file    Non-medical: Not on file  Tobacco Use  . Smoking status: Never Smoker  . Smokeless tobacco: Never Used  Substance and Sexual Activity  . Alcohol use: Never    Frequency: Never  . Drug use: Never  . Sexual activity: Not on file  Lifestyle  . Physical activity:    Days per week: Not on file    Minutes per session: Not on file  . Stress: Not on file  Relationships  . Social connections:    Talks on phone: Not on file    Gets together: Not on file    Attends religious service: Not on file    Active member of club or organization: Not on file    Attends meetings of clubs or organizations: Not on file    Relationship status: Not on file  .  Intimate partner violence:    Fear of current or ex partner: Not on file    Emotionally abused: Not on file    Physically abused: Not on file    Forced sexual activity: Not on file  Other Topics Concern  . Not on file  Social History Narrative  . Not on file    Review of Systems: See HPI, otherwise negative ROS  Physical Exam: BP 102/63   Pulse 67   Temp 97.6 F (36.4 C) (Tympanic)   Resp 18   Ht 6' (1.829 m)   Wt 97.5 kg   SpO2 98%   BMI 29.16 kg/m  General:   Alert,  pleasant and cooperative in NAD Head:  Normocephalic and atraumatic. Neck:  Supple; no masses or thyromegaly. Lungs:  Clear throughout to auscultation.    Heart:  Regular rate and rhythm. Abdomen:  Soft, nontender and nondistended. Normal bowel sounds, without guarding, and without rebound.   Neurologic:  Alert and  oriented x4;  grossly normal neurologically.  Impression/Plan: [name] is here for an colonoscopy to be performed for history of colon polyps  Risks, benefits, limitations, and alternatives regarding  colonoscopy have been reviewed with the patient.  Questions have been answered.  All parties agreeable.   Lucilla Lame, MD  09/27/2018, 10:21 AM

## 2018-09-27 NOTE — Transfer of Care (Signed)
Immediate Anesthesia Transfer of Care Note  Patient: Michael Ibarra  Procedure(s) Performed: COLONOSCOPY WITH PROPOFOL (N/A )  Patient Location: PACU  Anesthesia Type:General  Level of Consciousness: sedated  Airway & Oxygen Therapy: Patient Spontanous Breathing  Post-op Assessment: Report given to RN  Post vital signs: stable  Last Vitals:  Vitals Value Taken Time  BP 86/62 09/27/2018 10:52 AM  Temp 36.2 C 09/27/2018 10:51 AM  Pulse 63 09/27/2018 10:53 AM  Resp 12 09/27/2018 10:53 AM  SpO2 95 % 09/27/2018 10:53 AM  Vitals shown include unvalidated device data.  Last Pain:  Vitals:   09/27/18 1051  TempSrc: Tympanic  PainSc:          Complications: No apparent anesthesia complications

## 2018-09-27 NOTE — Anesthesia Post-op Follow-up Note (Signed)
Anesthesia QCDR form completed.        

## 2018-09-28 LAB — SURGICAL PATHOLOGY

## 2018-09-29 ENCOUNTER — Encounter: Payer: Self-pay | Admitting: Gastroenterology

## 2018-10-05 NOTE — Anesthesia Postprocedure Evaluation (Signed)
Anesthesia Post Note  Patient: Michael Ibarra  Procedure(s) Performed: COLONOSCOPY WITH PROPOFOL (N/A )  Patient location during evaluation: PACU Anesthesia Type: General Level of consciousness: awake and alert Pain management: pain level controlled Vital Signs Assessment: post-procedure vital signs reviewed and stable Respiratory status: spontaneous breathing, nonlabored ventilation, respiratory function stable and patient connected to nasal cannula oxygen Cardiovascular status: blood pressure returned to baseline and stable Postop Assessment: no apparent nausea or vomiting Anesthetic complications: no     Last Vitals:  Vitals:   09/27/18 1120 09/27/18 1122  BP:  109/65  Pulse: (!) 52 (!) 54  Resp: 16 12  Temp:    SpO2: 96% 96%    Last Pain:  Vitals:   09/27/18 1120  TempSrc:   PainSc: 0-No pain                 Molli Barrows

## 2018-10-05 NOTE — Anesthesia Preprocedure Evaluation (Signed)
Anesthesia Evaluation  Patient identified by MRN, date of birth, ID band Patient awake    Reviewed: Allergy & Precautions, H&P , NPO status , Patient's Chart, lab work & pertinent test results, reviewed documented beta blocker date and time   Airway Mallampati: II   Neck ROM: full    Dental  (+) Poor Dentition   Pulmonary sleep apnea ,    Pulmonary exam normal        Cardiovascular Exercise Tolerance: Good negative cardio ROS Normal cardiovascular exam Rhythm:regular Rate:Normal     Neuro/Psych negative neurological ROS  negative psych ROS   GI/Hepatic negative GI ROS, Neg liver ROS,   Endo/Other  negative endocrine ROSdiabetes, Well Controlled  Renal/GU negative Renal ROS  negative genitourinary   Musculoskeletal   Abdominal   Peds  Hematology negative hematology ROS (+)   Anesthesia Other Findings Past Medical History: No date: Cancer ([place])     Comment:  prostate No date: Diabetes mellitus without complication ([place]) No date: Sleep apnea Past Surgical History: No date: BACK SURGERY 09/27/2018: COLONOSCOPY WITH PROPOFOL; N/A     Comment:  Procedure: COLONOSCOPY WITH PROPOFOL;  Surgeon: Lucilla Lame, MD;  Location: [facility] ENDOSCOPY;  Service:               Endoscopy;  Laterality: N/A; No date: PROSTECTOMY BMI    Body Mass Index:  29.16 kg/m     Reproductive/Obstetrics negative OB ROS                             Anesthesia Physical Anesthesia Plan  ASA: III  Anesthesia Plan: General   Post-op Pain Management:    Induction:   PONV Risk Score and Plan:   Airway Management Planned:   Additional Equipment:   Intra-op Plan:   Post-operative Plan:   Informed Consent: I have reviewed the patients History and Physical, chart, labs and discussed the procedure including the risks, benefits and alternatives for the proposed anesthesia with the patient or authorized  representative who has indicated his/her understanding and acceptance.     Dental Advisory Given  Plan Discussed with: CRNA  Anesthesia Plan Comments:         Anesthesia Quick Evaluation

## 2019-01-28 ENCOUNTER — Encounter: Payer: Self-pay | Admitting: Emergency Medicine

## 2019-01-28 ENCOUNTER — Emergency Department
Admission: EM | Admit: 2019-01-28 | Discharge: 2019-01-28 | Disposition: A | Discharge status: Home / Self Care | Payer: Medicare Other | Attending: Emergency Medicine | Admitting: Emergency Medicine

## 2019-01-28 DIAGNOSIS — S8011XA Contusion of right lower leg, initial encounter: Secondary | ICD-10-CM | POA: Diagnosis not present

## 2019-01-28 DIAGNOSIS — S8992XA Unspecified injury of left lower leg, initial encounter: Secondary | ICD-10-CM | POA: Diagnosis present

## 2019-01-28 DIAGNOSIS — Y929 Unspecified place or not applicable: Secondary | ICD-10-CM | POA: Insufficient documentation

## 2019-01-28 DIAGNOSIS — Y998 Other external cause status: Secondary | ICD-10-CM | POA: Insufficient documentation

## 2019-01-28 DIAGNOSIS — S81852A Open bite, left lower leg, initial encounter: Secondary | ICD-10-CM | POA: Insufficient documentation

## 2019-01-28 DIAGNOSIS — W540XXA Bitten by dog, initial encounter: Secondary | ICD-10-CM | POA: Insufficient documentation

## 2019-01-28 DIAGNOSIS — E119 Type 2 diabetes mellitus without complications: Secondary | ICD-10-CM | POA: Diagnosis not present

## 2019-01-28 DIAGNOSIS — Z79899 Other long term (current) drug therapy: Secondary | ICD-10-CM | POA: Insufficient documentation

## 2019-01-28 DIAGNOSIS — Z8546 Personal history of malignant neoplasm of prostate: Secondary | ICD-10-CM | POA: Insufficient documentation

## 2019-01-28 DIAGNOSIS — Y9389 Activity, other specified: Secondary | ICD-10-CM | POA: Insufficient documentation

## 2019-01-28 DIAGNOSIS — Z7984 Long term (current) use of oral hypoglycemic drugs: Secondary | ICD-10-CM | POA: Insufficient documentation

## 2019-01-28 MED ORDER — HYDROCODONE-ACETAMINOPHEN 5-325 MG PO TABS: 1.0000 | ORAL_TABLET | Freq: Four times a day (QID) | ORAL | 0 refills | Status: DC | PRN

## 2019-01-28 MED ORDER — AMOXICILLIN-POT CLAVULANATE 875-125 MG PO TABS: 1.0000 | ORAL_TABLET | Freq: Two times a day (BID) | ORAL | 0 refills | Status: AC

## 2019-01-28 MED ORDER — LIDOCAINE HCL (PF) 1 % IJ SOLN
5.0000 mL | Freq: Once | INTRAMUSCULAR | Status: AC
  Administered 2019-01-28: 14:00:00 5 mL
  Filled 2019-01-28: qty 5

## 2019-01-28 NOTE — ED Provider Notes (Signed)
[facility] Emergency Department Provider Note   ____________________________________________   First MD Initiated Contact with Patient 01/28/19 1221     (approximate)  I have reviewed the triage vital signs and the nursing notes.   HISTORY  Chief Complaint Animal Bite   HPI Michael Ibarra is a 78 y.o. male presents to the ED after being attacked by a [place].  Patient states that he spoke to the owner who did not actually answer the question about the rabies immunizations for the dog.  He has laceration to his left lower leg and also was scratched/bitten to his right leg.  Patient is up-to-date on his tetanus.  He denies any other injuries.  He reported this to [facility] police and has already talked to the officer here at the hospital.  Arrangements are being made for animal control to check with the owner and get verification of rabies vaccine for the dog.  Rates his pain as 5/10.     Past Medical History:  Diagnosis Date  . Cancer [facility])    prostate  . Diabetes mellitus without complication ([place])   . Sleep apnea     Patient Active Problem List   Diagnosis Date Noted  . Personal history of colonic polyps   . Benign neoplasm of ascending colon     Past Surgical History:  Procedure Laterality Date  . BACK SURGERY    . COLONOSCOPY WITH PROPOFOL N/A 09/27/2018   Procedure: COLONOSCOPY WITH PROPOFOL;  Surgeon: Lucilla Lame, MD;  Location: [facility] ENDOSCOPY;  Service: Endoscopy;  Laterality: N/A;  . PROSTECTOMY      Prior to Admission medications   Medication Sig Start Date End Date Taking? Authorizing Provider  amoxicillin-clavulanate (AUGMENTIN) 875-125 MG tablet Take 1 tablet by mouth 2 (two) times daily for 7 days. 01/28/19 02/04/19  Johnn Hai, PA-C  atorvastatin (LIPITOR) 40 MG tablet Take 40 mg by mouth daily.    [provider]  HYDROcodone-acetaminophen (NORCO) 10-325 MG tablet Take 1 tablet by mouth every 6 (six) hours as  needed.    [provider]  HYDROcodone-acetaminophen (NORCO/VICODIN) 5-325 MG tablet Take 1 tablet by mouth every 6 (six) hours as needed for moderate pain. 01/28/19   Johnn Hai, PA-C  metFORMIN (GLUCOPHAGE) 500 MG tablet Take 500 mg by mouth 2 (two) times daily with a meal.    [provider]    Allergies Morphine and related and Sulfa antibiotics  No family history on file.  Social History Social History   Tobacco Use  . Smoking status: Never Smoker  . Smokeless tobacco: Never Used  Substance Use Topics  . Alcohol use: Never    Frequency: Never  . Drug use: Never    Review of Systems Constitutional: No fever/chills Cardiovascular: Denies chest pain. Respiratory: Denies shortness of breath. Musculoskeletal: Positive bilateral leg pain. Skin: Positive dog bite lower legs. Neurological: Negative for headaches, focal weakness or numbness. ___________________________________________   PHYSICAL EXAM:  VITAL SIGNS: ED Triage Vitals  Enc Vitals Group     BP 01/28/19 1200 (!) 128/94     Pulse Rate 01/28/19 1159 84     Resp 01/28/19 1159 16     Temp 01/28/19 1159 98.1 F (36.7 C)     Temp Source 01/28/19 1159 Oral     SpO2 01/28/19 1159 96 %     Weight 01/28/19 1158 216 lb (98 kg)     Height 01/28/19 1158 6' (1.829 m)  Head Circumference --      Peak Flow --      Pain Score 01/28/19 1157 5     Pain Loc --      Pain Edu? --      Excl. in [place]? --    Constitutional: Alert and oriented. Well appearing and in no acute distress. Eyes: Conjunctivae are normal.  Head: Atraumatic. Neck: No stridor.   Cardiovascular: Normal rate, regular rhythm. Grossly normal heart sounds.  Good peripheral circulation. Respiratory: Normal respiratory effort.  No retractions. Lungs CTAB. Musculoskeletal: Patient is able to move lower extremities without difficulty and was ambulatory without any assistance.  Patient is able to move upper remedies and there was no  injury noted. Neurologic:  Normal speech and language. No gross focal neurologic deficits are appreciated. No gait instability. Skin:  Skin is warm, dry.  Right medial lower leg there is superficial abrasions and ecchymosis noted.  There is no active bleeding in this area.  On examination of the left lower leg medial aspect there is a 4 cm laceration secondary to his dog bite.  No active bleeding is noted.  No foreign body. Psychiatric: Mood and affect are normal. Speech and behavior are normal.  ____________________________________________   LABS (all labs ordered are listed, but only abnormal results are displayed)  Labs Reviewed - No data to display   PROCEDURES  Procedure(s) performed (including Critical Care):  Marland KitchenMarland KitchenLaceration Repair  Date/Time: 01/28/2019 1:28 PM Performed by: Johnn Hai, PA-C Authorized by: Johnn Hai, PA-C   Consent:    Consent obtained:  Verbal   Consent given by:  Patient   Risks discussed:  Pain, infection and poor wound healing Anesthesia (see MAR for exact dosages):    Anesthesia method:  Local infiltration   Local anesthetic:  Lidocaine 1% w/o epi Laceration details:    Location:  Leg   Leg location:  L upper leg   Length (cm):  4 Repair type:    Repair type:  Simple Pre-procedure details:    Preparation:  Patient was prepped and draped in usual sterile fashion Exploration:    Hemostasis achieved with:  Direct pressure   Contaminated: no   Treatment:    Area cleansed with:  Betadine and saline   Amount of cleaning:  Extensive   Irrigation solution:  Sterile saline   Irrigation method:  Pressure wash and syringe   Visualized foreign bodies/material removed: no   Skin repair:    Repair method:  Sutures   Suture size:  4-0   Suture material:  Nylon   Suture technique:  Simple interrupted   Number of sutures:  3 Approximation:    Approximation:  Loose Post-procedure details:    Dressing:  Non-adherent dressing   Patient  tolerance of procedure:  Tolerated well, no immediate complications     ____________________________________________   INITIAL IMPRESSION / ASSESSMENT AND PLAN / ED COURSE  As part of my medical decision making, I reviewed the following data within the electronic MEDICAL RECORD NUMBER Notes from prior ED visits and [place] Controlled Substance Database  78 year old male presents to the ED after being bit by a dog today.  Patient has laceration to his left leg and superficial abrasions with ecchymosis to his right leg.  Patient is up-to-date on his tetanus immunizations.  Police and animal control have been notified and will check to see the status of the dog's immunizations.  Area was cleaned and 3 sutures were placed to loosely close this area.  Patient will continue to clean the area daily and watch for any signs of infection.  He was discharged with prescription for Augmentin 875 twice daily for 7 days and Norco every 6 hours as needed for pain.  He is to return for suture removal in approximately 10 days.  ____________________________________________   FINAL CLINICAL IMPRESSION(S) / ED DIAGNOSES  Final diagnoses:  Dog bite of left lower leg, initial encounter  Contusion of right lower extremity, initial encounter  Dog bite, initial encounter     ED Discharge Orders         Ordered    amoxicillin-clavulanate (AUGMENTIN) 875-125 MG tablet  2 times daily     01/28/19 1358    HYDROcodone-acetaminophen (NORCO/VICODIN) 5-325 MG tablet  Every 6 hours PRN     01/28/19 1358           Note:  This document was prepared using Dragon voice recognition software and may include unintentional dictation errors.    Johnn Hai, PA-C 01/28/19 1651    Delman Kitten, MD 01/29/19 [phone]

## 2019-01-28 NOTE — ED Triage Notes (Signed)
Pt to ED via POV for dog Bite. It was not was pts dog. Bite has not been reported. Pt is in NAD.

## 2019-01-28 NOTE — ED Notes (Signed)
Bite reported to [facility] by [employer].

## 2019-01-28 NOTE — Discharge Instructions (Signed)
Follow-up with the Police Department or animal control as to this dog's rabies status.  If this dog is not up-to-date on immunizations most likely they will quarantine the dog for observation.  They may instruct you to return to the emergency department for rabies vaccine.  Keep area clean and dry.  Clean daily with mild soap and water.  Begin taking Augmentin 875 twice a day for the next 7 days.  Norco is for pain if needed.  This medication contains a narcotic and should not be taken while driving or operating machinery.  If any worsening of the dog bite such as fever, chills, yellow drainage or swelling of your leg return to the emergency department.

## 2019-10-19 ENCOUNTER — Other Ambulatory Visit: Payer: Self-pay | Admitting: Physical Medicine and Rehabilitation

## 2019-10-19 DIAGNOSIS — M5416 Radiculopathy, lumbar region: Secondary | ICD-10-CM

## 2019-10-23 ENCOUNTER — Other Ambulatory Visit: Payer: Self-pay

## 2019-10-23 ENCOUNTER — Ambulatory Visit
Admission: RE | Admit: 2019-10-23 | Discharge: 2019-10-23 | Disposition: A | Discharge status: Home / Self Care | Payer: Medicare Other | Source: Ambulatory Visit | Attending: Physical Medicine and Rehabilitation | Admitting: Physical Medicine and Rehabilitation

## 2019-10-23 DIAGNOSIS — M5416 Radiculopathy, lumbar region: Secondary | ICD-10-CM | POA: Diagnosis present

## 2019-12-08 ENCOUNTER — Other Ambulatory Visit: Payer: Self-pay

## 2019-12-08 ENCOUNTER — Encounter (INDEPENDENT_AMBULATORY_CARE_PROVIDER_SITE_OTHER): Payer: Self-pay

## 2019-12-08 ENCOUNTER — Ambulatory Visit (INDEPENDENT_AMBULATORY_CARE_PROVIDER_SITE_OTHER): Payer: Medicare Other | Admitting: Internal Medicine

## 2019-12-08 DIAGNOSIS — R0683 Snoring: Secondary | ICD-10-CM

## 2019-12-08 DIAGNOSIS — M47812 Spondylosis without myelopathy or radiculopathy, cervical region: Secondary | ICD-10-CM | POA: Insufficient documentation

## 2019-12-08 DIAGNOSIS — Z8249 Family history of ischemic heart disease and other diseases of the circulatory system: Secondary | ICD-10-CM | POA: Diagnosis not present

## 2019-12-08 DIAGNOSIS — R197 Diarrhea, unspecified: Secondary | ICD-10-CM

## 2019-12-08 DIAGNOSIS — E785 Hyperlipidemia, unspecified: Secondary | ICD-10-CM | POA: Insufficient documentation

## 2019-12-08 DIAGNOSIS — R7303 Prediabetes: Secondary | ICD-10-CM

## 2019-12-08 NOTE — Progress Notes (Signed)
Established Patient Office Visit  Subjective:  Patient ID: Michael Ibarra, male    DOB: 1940/08/27  Age: 79 y.o. MRN: [id number]  CC:  Chief Complaint  Patient presents with  . Lab results    HPI  Michael Ibarra presents for lab test checkup and evaluation his neck pain is better he has not been taking his test statin as directed for some of the lipids are high.  I personally reviewed patient labs and asked to change his lifestyle take his medication on a regular basis  Past Medical History:  Diagnosis Date  . Cancer [facility])    prostate  . Diabetes mellitus without complication ([place])   . Sleep apnea     Past Surgical History:  Procedure Laterality Date  . BACK SURGERY    . COLONOSCOPY WITH PROPOFOL N/A 09/27/2018   Procedure: COLONOSCOPY WITH PROPOFOL;  Surgeon: Lucilla Lame, MD;  Location: [facility] ENDOSCOPY;  Service: Endoscopy;  Laterality: N/A;  . PROSTECTOMY      No family history on file.  Social History   Socioeconomic History  . Marital status: Married    Spouse name: Not on file  . Number of children: Not on file  . Years of education: Not on file  . Highest education level: Not on file  Occupational History  . Not on file  Tobacco Use  . Smoking status: Never Smoker  . Smokeless tobacco: Never Used  Substance and Sexual Activity  . Alcohol use: Never  . Drug use: Never  . Sexual activity: Not on file  Other Topics Concern  . Not on file  Social History Narrative  . Not on file   Social Determinants of Health   Financial Resource Strain:   . Difficulty of Paying Living Expenses:   Food Insecurity:   . Worried About Charity fundraiser in the Last Year:   . Arboriculturist in the Last Year:   Transportation Needs:   . Film/video editor (Medical):   Marland Kitchen Lack of Transportation (Non-Medical):   Physical Activity:   . Days of Exercise per Week:   . Minutes of Exercise per Session:   Stress:   . Feeling of Stress :   Social Connections:   .  Frequency of Communication with Friends and Family:   . Frequency of Social Gatherings with Friends and Family:   . Attends Religious Services:   . Active Member of Clubs or Organizations:   . Attends Archivist Meetings:   Marland Kitchen Marital Status:   Intimate Partner Violence:   . Fear of Current or Ex-Partner:   . Emotionally Abused:   Marland Kitchen Physically Abused:   . Sexually Abused:      Current Outpatient Medications:  .  atorvastatin (LIPITOR) 40 MG tablet, Take 40 mg by mouth daily., Disp: , Rfl:  .  HYDROcodone-acetaminophen (NORCO) 10-325 MG tablet, Take 1 tablet by mouth every 6 (six) hours as needed., Disp: , Rfl:  .  metFORMIN (GLUCOPHAGE) 500 MG tablet, Take 500 mg by mouth 2 (two) times daily with a meal., Disp: , Rfl:    Allergies  Allergen Reactions  . Morphine And Related Other (See Comments)    headaches  . Sulfa Antibiotics Other (See Comments)    headaches    ROS Review of Systems  Constitutional: Negative for appetite change, chills and fatigue.  HENT: Negative for congestion, sinus pain and sneezing.   Eyes: Positive for itching.  Cardiovascular: Positive for chest pain  and leg swelling.  Gastrointestinal: Negative for blood in stool.  Endocrine: Negative.   Musculoskeletal:       Severe degenerative arthiritis of neck and c spine  Allergic/Immunologic: Negative.   Neurological: Negative.   Hematological: Negative.   Psychiatric/Behavioral: Negative.       Objective:    Physical Exam  Constitutional: He appears well-developed and well-nourished.  HENT:  Head: Normocephalic and atraumatic.  Eyes: Pupils are equal, round, and reactive to light.  Neck: Trachea normal. No hepatojugular reflux and no JVD present. Carotid bruit is not present. No thyromegaly present.  Musculoskeletal:     Right shoulder: Tenderness, pain and spasms present. No swelling, effusion or crepitus. Decreased range of motion.     Cervical back: Spinous process tenderness and  muscular tenderness present.  Neurological: He is alert.    There were no vitals taken for this visit. Wt Readings from Last 3 Encounters:  01/28/19 216 lb (98 kg)  09/27/18 215 lb (97.5 kg)  08/24/11 209 lb (94.8 kg)     Health Maintenance Due  Topic Date Due  . URINE MICROALBUMIN  Never done  . PNA vac Low Risk Adult (1 of 2 - PCV13) Never done    There are no preventive care reminders to display for this patient.  No results found for: TSH No results found for: WBC, HGB, HCT, MCV, PLT Lab Results  Component Value Date   CREATININE 1.28 (H) 05/14/2015   No results found for: CHOL No results found for: HDL No results found for: LDLCALC No results found for: TRIG No results found for: CHOLHDL No results found for: HGBA1C    Assessment & Plan:   Problem List Items Addressed This Visit      Musculoskeletal and Integument   Osteoarthritis of cervical spine     Other   Snoring - Primary    H/o snoring and apnea Ordered sleep study      Relevant Orders   Ambulatory referral to Sleep Studies   Family history of abdominal aortic aneurysm    Ordered  US of abdomen      Relevant Orders   US Abdomen Limited   Dyslipidemia    Advised to take statin       Prediabetes    Diet and excercise      Diarrhea    Watchful waiting         No orders of the defined types were placed in this encounter.   Follow-up: Return in about 3 months (around 03/09/2020).    Cletis Athens, MD

## 2019-12-08 NOTE — Assessment & Plan Note (Signed)
Watchful waiting.  

## 2019-12-08 NOTE — Assessment & Plan Note (Signed)
H/o snoring and apnea Ordered sleep study

## 2019-12-08 NOTE — Assessment & Plan Note (Signed)
Advised to take statin

## 2019-12-08 NOTE — Assessment & Plan Note (Signed)
Diet and excercise

## 2019-12-08 NOTE — Assessment & Plan Note (Signed)
Ordered  US of abdomen

## 2019-12-15 ENCOUNTER — Other Ambulatory Visit: Payer: Self-pay | Admitting: Internal Medicine

## 2019-12-15 ENCOUNTER — Ambulatory Visit
Admission: RE | Admit: 2019-12-15 | Discharge: 2019-12-15 | Disposition: A | Discharge status: Home / Self Care | Payer: Medicare Other | Source: Ambulatory Visit | Attending: Internal Medicine | Admitting: Internal Medicine

## 2019-12-15 ENCOUNTER — Other Ambulatory Visit: Payer: Self-pay

## 2019-12-15 DIAGNOSIS — K8020 Calculus of gallbladder without cholecystitis without obstruction: Secondary | ICD-10-CM

## 2019-12-15 DIAGNOSIS — Z8249 Family history of ischemic heart disease and other diseases of the circulatory system: Secondary | ICD-10-CM

## 2019-12-15 DIAGNOSIS — R1011 Right upper quadrant pain: Secondary | ICD-10-CM

## 2019-12-15 DIAGNOSIS — I700 Atherosclerosis of aorta: Secondary | ICD-10-CM | POA: Diagnosis not present

## 2019-12-15 DIAGNOSIS — I77811 Abdominal aortic ectasia: Secondary | ICD-10-CM | POA: Diagnosis not present

## 2020-01-19 ENCOUNTER — Other Ambulatory Visit: Payer: Self-pay | Admitting: Internal Medicine

## 2020-01-24 ENCOUNTER — Other Ambulatory Visit: Payer: Self-pay | Admitting: *Deleted

## 2020-01-24 MED ORDER — ONETOUCH ULTRA VI STRP: ORAL_STRIP | 6 refills | Status: AC

## 2020-02-12 ENCOUNTER — Other Ambulatory Visit: Payer: Self-pay

## 2020-02-12 ENCOUNTER — Ambulatory Visit (INDEPENDENT_AMBULATORY_CARE_PROVIDER_SITE_OTHER): Payer: Medicare Other | Admitting: Internal Medicine

## 2020-02-12 ENCOUNTER — Encounter: Payer: Self-pay | Admitting: Internal Medicine

## 2020-02-12 VITALS — BP 122/90 | HR 88 | Ht 70.0 in | Wt 218.1 lb

## 2020-02-12 DIAGNOSIS — M47812 Spondylosis without myelopathy or radiculopathy, cervical region: Secondary | ICD-10-CM

## 2020-02-12 DIAGNOSIS — E119 Type 2 diabetes mellitus without complications: Secondary | ICD-10-CM | POA: Diagnosis not present

## 2020-02-12 DIAGNOSIS — E785 Hyperlipidemia, unspecified: Secondary | ICD-10-CM

## 2020-02-12 DIAGNOSIS — R0683 Snoring: Secondary | ICD-10-CM | POA: Diagnosis not present

## 2020-02-12 MED ORDER — HYDROCODONE-ACETAMINOPHEN 10-325 MG PO TABS: 1.0000 | ORAL_TABLET | Freq: Four times a day (QID) | ORAL | 0 refills | Status: DC | PRN

## 2020-02-12 NOTE — Progress Notes (Signed)
Established Patient Office Visit  SUBJECTIVE:  Subjective  Patient ID: Michael Ibarra, male    DOB: 09-25-1940  Age: 79 y.o. MRN: [id number]  CC:  Chief Complaint  Patient presents with  . Diabetes    patient checked blood sugar from home and it was 140  . Back Pain    patient needs refill of pain medication     HPI Michael Ibarra is a 79 y.o. male presenting today for a diabetes check and a refill on his back pain medication.   He checked his blood sugar today, and it was 140.   He continues to have back and neck pain. He notes that the pain radiates up his back and down his shoulder. He states that his surgeon told him the pain is from where he is having calcium buildup around his prior surgery, creating an impingement. He also notes that he was told he is now too old for additional surgery. He tries to distract himself from the pain as much as possible.   He got some recent bad news regarding his son-in-law's health, so he has some mild stress related to that.   Past Medical History:  Diagnosis Date  . Cancer [facility])    prostate  . Diabetes mellitus without complication ([place])   . Sleep apnea     Past Surgical History:  Procedure Laterality Date  . BACK SURGERY    . COLONOSCOPY WITH PROPOFOL N/A 09/27/2018   Procedure: COLONOSCOPY WITH PROPOFOL;  Surgeon: Lucilla Lame, MD;  Location: [facility] ENDOSCOPY;  Service: Endoscopy;  Laterality: N/A;  . PROSTECTOMY      History reviewed. No pertinent family history.  Social History   Socioeconomic History  . Marital status: Married    Spouse name: Not on file  . Number of children: Not on file  . Years of education: Not on file  . Highest education level: Not on file  Occupational History  . Not on file  Tobacco Use  . Smoking status: Never Smoker  . Smokeless tobacco: Never Used  Vaping Use  . Vaping Use: Never used  Substance and Sexual Activity  . Alcohol use: Never  . Drug use: Never  . Sexual activity: Not on  file  Other Topics Concern  . Not on file  Social History Narrative  . Not on file   Social Determinants of Health   Financial Resource Strain:   . Difficulty of Paying Living Expenses:   Food Insecurity:   . Worried About Charity fundraiser in the Last Year:   . Arboriculturist in the Last Year:   Transportation Needs:   . Film/video editor (Medical):   Marland Kitchen Lack of Transportation (Non-Medical):   Physical Activity:   . Days of Exercise per Week:   . Minutes of Exercise per Session:   Stress:   . Feeling of Stress :   Social Connections:   . Frequency of Communication with Friends and Family:   . Frequency of Social Gatherings with Friends and Family:   . Attends Religious Services:   . Active Member of Clubs or Organizations:   . Attends Archivist Meetings:   Marland Kitchen Marital Status:   Intimate Partner Violence:   . Fear of Current or Ex-Partner:   . Emotionally Abused:   Marland Kitchen Physically Abused:   . Sexually Abused:      Current Outpatient Medications:  .  atorvastatin (LIPITOR) 40 MG tablet, Take 40 mg by mouth  daily., Disp: , Rfl:  .  glucose blood (ONETOUCH ULTRA) test strip, Use to check blood sugar twice daily, Disp: 100 strip, Rfl: 6 .  HYDROcodone-acetaminophen (NORCO) 10-325 MG tablet, Take 1 tablet by mouth every 6 (six) hours as needed., Disp: 60 tablet, Rfl: 0 .  metFORMIN (GLUCOPHAGE) 500 MG tablet, Take 500 mg by mouth 2 (two) times daily with a meal., Disp: , Rfl:  .  sitaGLIPtin (JANUVIA) 100 MG tablet, Take 1 tablet by mouth daily., Disp: , Rfl:    Allergies  Allergen Reactions  . Morphine And Related Other (See Comments)    headaches  . Sulfa Antibiotics Other (See Comments)    headaches    ROS Review of Systems  Constitutional: Negative.   HENT: Negative.   Eyes: Negative.   Respiratory: Negative.   Cardiovascular: Negative.   Gastrointestinal: Negative.   Endocrine: Negative.   Genitourinary: Negative.   Musculoskeletal: Positive  for back pain and neck pain.  Skin: Negative.   Allergic/Immunologic: Negative.   Neurological: Negative.   Hematological: Negative.   Psychiatric/Behavioral: Negative.   All other systems reviewed and are negative.    OBJECTIVE:    Physical Exam Vitals reviewed.  Constitutional:      Appearance: Normal appearance.  Neck:     Vascular: No carotid bruit.  Cardiovascular:     Rate and Rhythm: Normal rate and regular rhythm.     Pulses: Normal pulses.     Heart sounds: Normal heart sounds.  Pulmonary:     Effort: Pulmonary effort is normal.     Breath sounds: Normal breath sounds.  Abdominal:     General: Bowel sounds are normal.     Palpations: Abdomen is soft. There is no hepatomegaly or splenomegaly.     Tenderness: There is no abdominal tenderness.     Hernia: No hernia is present.  Musculoskeletal:     Right lower leg: No edema.     Left lower leg: No edema.  Skin:    Findings: No rash.  Neurological:     Mental Status: He is alert and oriented to person, place, and time.  Psychiatric:        Mood and Affect: Mood normal.        Behavior: Behavior normal.     BP 122/90   Pulse 88   Ht _0  (1.778 m)   Wt 218 lb 1.6 oz (98.9 kg)   BMI 31.29 kg/m  Wt Readings from Last 3 Encounters:  02/12/20 218 lb 1.6 oz (98.9 kg)  01/28/19 216 lb (98 kg)  09/27/18 215 lb (97.5 kg)    Health Maintenance Due  Topic Date Due  . HEMOGLOBIN A1C  Never done  . Hepatitis C Screening  Never done  . FOOT EXAM  Never done  . OPHTHALMOLOGY EXAM  Never done  . URINE MICROALBUMIN  Never done  . PNA vac Low Risk Adult (1 of 2 - PCV13) Never done    There are no preventive care reminders to display for this patient.  No flowsheet data found. CMP Latest Ref Rng & Units 05/14/2015  Creatinine 0.61 - 1.24 mg/dL 1.28(H)    No results found for: TSH No results found for: ALBUMIN, ANIONGAP, EGFR, GFR No results found for: CHOL, HDL, LDLCALC, CHOLHDL No results found for:  TRIG No results found for: HGBA1C    ASSESSMENT & PLAN:   Problem List Items Addressed This Visit      Endocrine   Diabetes mellitus without  complication (HCC)    Blood sugar is under control at the present time      Relevant Medications   sitaGLIPtin (JANUVIA) 100 MG tablet     Musculoskeletal and Integument   Osteoarthritis of cervical spine - Primary    Patient has surgery of the neck about 10 years ago he however continues to have pain in the back of the neck due to underlying spinal stenosis and calcium buildup.      Relevant Medications   HYDROcodone-acetaminophen (NORCO) 10-325 MG tablet     Other   Snoring    Snoring is a chronic problem is stable at the present time      Dyslipidemia    - The patient's hyperlipidemia is stable onstatin - The patient will continue the current treatment regimen.  - I encouraged the patient to eat more vegetables and whole wheat, and to avoid fatty foods like whole milk, hard cheese, egg yolks, margarine, baked sweets, and fried foods.  - I encouraged the patient to live an active lifestyle and complete activities for 40 minutes at least three times per week.  - I instructed the patient to go to the ER if they begin having chest pain.          Meds ordered this encounter  Medications  . HYDROcodone-acetaminophen (NORCO) 10-325 MG tablet    Sig: Take 1 tablet by mouth every 6 (six) hours as needed.    Dispense:  60 tablet    Refill:  0      Follow-up: No follow-ups on file.    Dr. Jane Canary [facility] [address], [place], [place] [address]   By signing my name below, I, [employer], attest that this documentation has been prepared under the direction and in the presence of Cletis Athens, MD. Electronically Signed: Cletis Athens, MD 02/12/20, 3:06 PM   I personally performed the services described in this documentation, which was SCRIBED in my presence. The recorded information has been reviewed and  considered accurate. It has been edited as necessary during review. Cletis Athens, MD

## 2020-02-12 NOTE — Assessment & Plan Note (Signed)
Blood sugar is under control at the present time. 

## 2020-02-12 NOTE — Assessment & Plan Note (Signed)

## 2020-02-12 NOTE — Assessment & Plan Note (Signed)
Patient has surgery of the neck about 10 years ago he however continues to have pain in the back of the neck due to underlying spinal stenosis and calcium buildup.

## 2020-02-12 NOTE — Assessment & Plan Note (Signed)
Snoring is a chronic problem is stable at the present time

## 2020-04-04 ENCOUNTER — Other Ambulatory Visit: Payer: Self-pay | Admitting: Internal Medicine

## 2020-04-19 ENCOUNTER — Ambulatory Visit: Payer: Medicare Other | Admitting: Internal Medicine

## 2020-04-19 ENCOUNTER — Other Ambulatory Visit: Payer: Self-pay

## 2020-04-19 ENCOUNTER — Ambulatory Visit (INDEPENDENT_AMBULATORY_CARE_PROVIDER_SITE_OTHER): Payer: Medicare Other | Admitting: Internal Medicine

## 2020-04-19 ENCOUNTER — Encounter: Payer: Self-pay | Admitting: Internal Medicine

## 2020-04-19 VITALS — BP 123/69 | HR 62 | Ht 71.0 in | Wt 214.6 lb

## 2020-04-19 DIAGNOSIS — E119 Type 2 diabetes mellitus without complications: Secondary | ICD-10-CM | POA: Diagnosis not present

## 2020-04-19 DIAGNOSIS — M47812 Spondylosis without myelopathy or radiculopathy, cervical region: Secondary | ICD-10-CM

## 2020-04-19 DIAGNOSIS — Z23 Encounter for immunization: Secondary | ICD-10-CM | POA: Diagnosis not present

## 2020-04-19 DIAGNOSIS — Z0000 Encounter for general adult medical examination without abnormal findings: Secondary | ICD-10-CM | POA: Diagnosis not present

## 2020-04-19 DIAGNOSIS — E139 Other specified diabetes mellitus without complications: Secondary | ICD-10-CM | POA: Diagnosis not present

## 2020-04-19 LAB — GLUCOSE, POCT (MANUAL RESULT ENTRY): POC Glucose: 171 mg/dl — AB (ref 70–99)

## 2020-04-19 MED ORDER — HYDROCODONE-ACETAMINOPHEN 10-325 MG PO TABS: 1.0000 | ORAL_TABLET | Freq: Four times a day (QID) | ORAL | 0 refills | Status: DC | PRN

## 2020-04-19 NOTE — Progress Notes (Signed)
Established Patient Office Visit  Subjective:  Patient ID: Michael Ibarra, male    DOB: 03-15-1941  Age: 79 y.o. MRN: [id number]  CC:  Chief Complaint  Patient presents with  . Annual Exam     Michael Ibarra presents for physical  Past Medical History:  Diagnosis Date  . Cancer [facility])    prostate  . Diabetes mellitus without complication ([place])   . Sleep apnea     Past Surgical History:  Procedure Laterality Date  . BACK SURGERY    . COLONOSCOPY WITH PROPOFOL N/A 09/27/2018   Procedure: COLONOSCOPY WITH PROPOFOL;  Surgeon: Lucilla Lame, MD;  Location: [facility] ENDOSCOPY;  Service: Endoscopy;  Laterality: N/A;  . PROSTECTOMY      History reviewed. No pertinent family history.  Social History   Socioeconomic History  . Marital status: Married    Spouse name: Not on file  . Number of children: Not on file  . Years of education: Not on file  . Highest education level: Not on file  Occupational History  . Not on file  Tobacco Use  . Smoking status: Never Smoker  . Smokeless tobacco: Never Used  Vaping Use  . Vaping Use: Never used  Substance and Sexual Activity  . Alcohol use: Never  . Drug use: Never  . Sexual activity: Not on file  Other Topics Concern  . Not on file  Social History Narrative  . Not on file   Social Determinants of Health   Financial Resource Strain:   . Difficulty of Paying Living Expenses: Not on file  Food Insecurity:   . Worried About Charity fundraiser in the Last Year: Not on file  . Ran Out of Food in the Last Year: Not on file  Transportation Needs:   . Ibarra of Transportation (Medical): Not on file  . Ibarra of Transportation (Non-Medical): Not on file  Physical Activity:   . Days of Exercise per Week: Not on file  . Minutes of Exercise per Session: Not on file  Stress:   . Feeling of Stress : Not on file  Social Connections:   . Frequency of Communication with Friends and Family: Not on file  . Frequency of Social Gatherings  with Friends and Family: Not on file  . Attends Religious Services: Not on file  . Active Member of Clubs or Organizations: Not on file  . Attends Archivist Meetings: Not on file  . Marital Status: Not on file  Intimate Partner Violence:   . Fear of Current or Ex-Partner: Not on file  . Emotionally Abused: Not on file  . Physically Abused: Not on file  . Sexually Abused: Not on file     Current Outpatient Medications:  .  atorvastatin (LIPITOR) 40 MG tablet, Take 40 mg by mouth daily., Disp: , Rfl:  .  glucose blood (ONETOUCH ULTRA) test strip, Use to check blood sugar twice daily, Disp: 100 strip, Rfl: 6 .  HYDROcodone-acetaminophen (NORCO) 10-325 MG tablet, Take 1 tablet by mouth every 6 (six) hours as needed., Disp: 60 tablet, Rfl: 0 .  metFORMIN (GLUCOPHAGE) 500 MG tablet, Take 500 mg by mouth 2 (two) times daily with a meal., Disp: , Rfl:  .  pantoprazole (PROTONIX) 40 MG tablet, TAKE 1 TABLET BY MOUTH DAILY, Disp: 90 tablet, Rfl: 3 .  sitaGLIPtin (JANUVIA) 100 MG tablet, Take 1 tablet by mouth daily., Disp: , Rfl:    Allergies  Allergen Reactions  . Morphine And  Related Other (See Comments)    headaches  . Sulfa Antibiotics Other (See Comments)    headaches    ROS Review of Systems  Constitutional: Positive for fatigue. Negative for chills, diaphoresis and fever.  HENT: Negative for congestion and sore throat.   Eyes: Negative for photophobia and redness.  Respiratory: Negative for cough, chest tightness and wheezing.   Cardiovascular: Negative for chest pain, palpitations and leg swelling.  Gastrointestinal: Positive for nausea. Negative for abdominal pain, anal bleeding, blood in stool, constipation, diarrhea and vomiting.  Genitourinary: Positive for enuresis. Negative for flank pain, hematuria, penile swelling and urgency.  Musculoskeletal: Positive for arthralgias and neck pain.       Pt has   ch neck pain  Skin: Negative.   Neurological: Positive for  dizziness. Negative for vertigo, tremors, seizures, syncope, speech difficulty and headaches.      Objective:    Physical Exam Vitals reviewed.  Constitutional:      Appearance: Normal appearance.  HENT:     Mouth/Throat:     Mouth: Mucous membranes are moist.  Eyes:     Pupils: Pupils are equal, round, and reactive to light.  Neck:     Vascular: No carotid bruit.  Cardiovascular:     Rate and Rhythm: Normal rate and regular rhythm.     Pulses: Normal pulses.     Heart sounds: Normal heart sounds.  Pulmonary:     Effort: Pulmonary effort is normal.     Breath sounds: Normal breath sounds.  Abdominal:     General: Bowel sounds are normal.     Palpations: Abdomen is soft. There is no hepatomegaly, splenomegaly or mass.     Tenderness: There is no abdominal tenderness.     Hernia: No hernia is present.  Musculoskeletal:     Cervical back: Neck supple.     Right lower leg: No edema.     Left lower leg: No edema.  Skin:    Findings: No rash.  Neurological:     Mental Status: He is alert and oriented to person, place, and time.     Motor: No weakness.  Psychiatric:        Mood and Affect: Mood normal.        Behavior: Behavior normal.     BP 123/69   Pulse 62   Ht 5\' 11"  (1.803 m)   Wt 214 lb 9.6 oz (97.3 kg)   BMI 29.93 kg/m  Wt Readings from Last 3 Encounters:  04/19/20 214 lb 9.6 oz (97.3 kg)  02/12/20 218 lb 1.6 oz (98.9 kg)  01/28/19 216 lb (98 kg)     Health Maintenance Due  Topic Date Due  . HEMOGLOBIN A1C  Never done  . Hepatitis C Screening  Never done  . FOOT EXAM  Never done  . OPHTHALMOLOGY EXAM  Never done  . URINE MICROALBUMIN  Never done  . PNA vac Low Risk Adult (1 of 2 - PCV13) Never done    There are no preventive care reminders to display for this patient.  No results found for: TSH No results found for: WBC, HGB, HCT, MCV, PLT Lab Results  Component Value Date   CREATININE 1.28 (H) 05/14/2015   No results found for: CHOL No  results found for: HDL No results found for: LDLCALC No results found for: TRIG No results found for: CHOLHDL No results found for: HGBA1C    Assessment & Plan:   Problem List Items Addressed This Visit  Endocrine   Diabetes mellitus without complication ([place])    - The patient's blood sugar is not under control on metformin. - The patient will continue the current treatment regimen.  - I encouraged the patient to regularly check blood sugar.  - I encouraged the patient to monitor diet. I encouraged the patient to eat low-carb and low-sugar to help prevent blood sugar spikes.  - I encouraged the patient to continue following their prescribed treatment plan for diabetes - I informed the patient to get help if blood sugar drops below 54mg /dL, or if suddenly have trouble thinking clearly or breathing.   controll his diet         Relevant Orders   POCT glucose (manual entry) (Completed)   Diabetes 1.5, managed as type 2 (HCC)     Musculoskeletal and Integument   Osteoarthritis of cervical spine   Relevant Medications   HYDROcodone-acetaminophen (NORCO) 10-325 MG tablet     Other   Need for influenza vaccination - Primary   Relevant Orders   Flu Vaccine QUAD High Dose(Fluad) (Completed)      Meds ordered this encounter  Medications  . HYDROcodone-acetaminophen (NORCO) 10-325 MG tablet    Sig: Take 1 tablet by mouth every 6 (six) hours as needed.    Dispense:  60 tablet    Refill:  0    Follow-up: No follow-ups on file.    Cletis Athens, MD

## 2020-04-21 ENCOUNTER — Encounter: Payer: Self-pay | Admitting: Internal Medicine

## 2020-04-21 DIAGNOSIS — Z23 Encounter for immunization: Secondary | ICD-10-CM | POA: Insufficient documentation

## 2020-04-21 DIAGNOSIS — E139 Other specified diabetes mellitus without complications: Secondary | ICD-10-CM | POA: Insufficient documentation

## 2020-04-21 NOTE — Assessment & Plan Note (Signed)
-   The patient's blood sugar is not under control on metformin. - The patient will continue the current treatment regimen.  - I encouraged the patient to regularly check blood sugar.  - I encouraged the patient to monitor diet. I encouraged the patient to eat low-carb and low-sugar to help prevent blood sugar spikes.  - I encouraged the patient to continue following their prescribed treatment plan for diabetes - I informed the patient to get help if blood sugar drops below 54mg /dL, or if suddenly have trouble thinking clearly or breathing.   controll his diet

## 2020-05-21 ENCOUNTER — Other Ambulatory Visit: Payer: Self-pay | Admitting: Internal Medicine

## 2020-07-22 ENCOUNTER — Ambulatory Visit (INDEPENDENT_AMBULATORY_CARE_PROVIDER_SITE_OTHER): Payer: Medicare Other | Admitting: Internal Medicine

## 2020-07-22 ENCOUNTER — Other Ambulatory Visit: Payer: Self-pay

## 2020-07-22 ENCOUNTER — Encounter: Payer: Self-pay | Admitting: Internal Medicine

## 2020-07-22 VITALS — BP 114/61 | HR 87 | Temp 97.8°F | Ht 71.0 in | Wt 215.4 lb

## 2020-07-22 DIAGNOSIS — R0683 Snoring: Secondary | ICD-10-CM

## 2020-07-22 DIAGNOSIS — E139 Other specified diabetes mellitus without complications: Secondary | ICD-10-CM

## 2020-07-22 DIAGNOSIS — Z20822 Contact with and (suspected) exposure to covid-19: Secondary | ICD-10-CM

## 2020-07-22 DIAGNOSIS — M47812 Spondylosis without myelopathy or radiculopathy, cervical region: Secondary | ICD-10-CM

## 2020-07-22 DIAGNOSIS — R059 Cough, unspecified: Secondary | ICD-10-CM

## 2020-07-22 DIAGNOSIS — E785 Hyperlipidemia, unspecified: Secondary | ICD-10-CM

## 2020-07-22 DIAGNOSIS — J204 Acute bronchitis due to parainfluenza virus: Secondary | ICD-10-CM

## 2020-07-22 LAB — POC INFLUENZA TEST: Negative: NEGATIVE

## 2020-07-22 LAB — POC COVID19 BINAXNOW: SARS Coronavirus 2 Ag: NEGATIVE

## 2020-07-22 MED ORDER — LEVOFLOXACIN 500 MG PO TABS: 500.0000 mg | ORAL_TABLET | Freq: Every day | ORAL | 0 refills | Status: DC

## 2020-07-22 MED ORDER — HYDROCODONE-ACETAMINOPHEN 10-325 MG PO TABS: 1.0000 | ORAL_TABLET | Freq: Four times a day (QID) | ORAL | 0 refills | Status: AC | PRN

## 2020-07-22 NOTE — Assessment & Plan Note (Signed)
Pt pain med refilled

## 2020-07-22 NOTE — Assessment & Plan Note (Signed)
Controlled on diet.  

## 2020-07-22 NOTE — Assessment & Plan Note (Signed)
Covid test is negative

## 2020-07-22 NOTE — Assessment & Plan Note (Signed)
Complain of chest wall upper respiratory infection, he was raking leaves.  Chest examination does not reveal any rales or rhonchi.  He was started on Levaquin 500 mg p.o. daily for 7 days.

## 2020-07-22 NOTE — Assessment & Plan Note (Signed)
-   The patient's blood sugar is under control . - The patient will continue the current treatment regimen.  - I encouraged the patient to regularly check blood sugar.  - I encouraged the patient to monitor diet. I encouraged the patient to eat low-carb and low-sugar to help prevent blood sugar spikes.  - I encouraged the patient to continue following their prescribed treatment plan for diabetes - I informed the patient to get help if blood sugar drops below 54mg /dL, or if suddenly have trouble thinking clearly or breathing.

## 2020-07-22 NOTE — Progress Notes (Signed)
Established Patient Office Visit  Subjective:  Patient ID: Michael Ibarra, male    DOB: Jul 29, 1941  Age: 79 y.o. MRN: [id number]  CC:  Chief Complaint  Patient presents with  . URI    Patient complains of cough, congestion, fatigue. He took a covid home test yesterday with neg results. Patient will be tested again today at our office.    HPI  Michael Ibarra presents for chest cold.  Past Medical History:  Diagnosis Date  . Cancer [facility])    prostate  . Diabetes mellitus without complication ([place])   . Sleep apnea     Past Surgical History:  Procedure Laterality Date  . BACK SURGERY    . COLONOSCOPY WITH PROPOFOL N/A 09/27/2018   Procedure: COLONOSCOPY WITH PROPOFOL;  Surgeon: Lucilla Lame, MD;  Location: [facility] ENDOSCOPY;  Service: Endoscopy;  Laterality: N/A;  . PROSTECTOMY      History reviewed. No pertinent family history.  Social History   Socioeconomic History  . Marital status: Married    Spouse name: Not on file  . Number of children: Not on file  . Years of education: Not on file  . Highest education level: Not on file  Occupational History  . Not on file  Tobacco Use  . Smoking status: Never Smoker  . Smokeless tobacco: Never Used  Vaping Use  . Vaping Use: Never used  Substance and Sexual Activity  . Alcohol use: Never  . Drug use: Never  . Sexual activity: Not on file  Other Topics Concern  . Not on file  Social History Narrative  . Not on file   Social Determinants of Health   Financial Resource Strain: Not on file  Food Insecurity: Not on file  Transportation Needs: Not on file  Physical Activity: Not on file  Stress: Not on file  Social Connections: Not on file  Intimate Partner Violence: Not on file     Current Outpatient Medications:  .  atorvastatin (LIPITOR) 40 MG tablet, Take 40 mg by mouth daily., Disp: , Rfl:  .  glucose blood (ONETOUCH ULTRA) test strip, Use to check blood sugar twice daily, Disp: 100 strip, Rfl: 6 .   HYDROcodone-acetaminophen (NORCO) 10-325 MG tablet, Take 1 tablet by mouth every 6 (six) hours as needed., Disp: 60 tablet, Rfl: 0 .  levofloxacin (LEVAQUIN) 500 MG tablet, Take 1 tablet (500 mg total) by mouth daily., Disp: 7 tablet, Rfl: 0 .  metFORMIN (GLUCOPHAGE) 1000 MG tablet, TAKE 1 TABLET BY MOUTH TWICE DAILY, Disp: 60 tablet, Rfl: 6 .  metFORMIN (GLUCOPHAGE) 500 MG tablet, Take 500 mg by mouth 2 (two) times daily with a meal., Disp: , Rfl:  .  pantoprazole (PROTONIX) 40 MG tablet, TAKE 1 TABLET BY MOUTH DAILY, Disp: 90 tablet, Rfl: 3 .  sitaGLIPtin (JANUVIA) 100 MG tablet, Take 1 tablet by mouth daily., Disp: , Rfl:    Allergies  Allergen Reactions  . Morphine And Related Other (See Comments)    headaches  . Sulfa Antibiotics Other (See Comments)    headaches    ROS Review of Systems  Constitutional: Negative.  Negative for activity change, appetite change, chills, diaphoresis and fever.  HENT: Positive for sore throat.   Eyes: Negative.   Respiratory: Positive for chest tightness and shortness of breath.   Cardiovascular: Negative.   Gastrointestinal: Negative.   Endocrine: Negative.   Genitourinary: Negative.   Musculoskeletal: Negative.   Skin: Negative.   Allergic/Immunologic: Negative.   Neurological: Negative.  Hematological: Negative.   Psychiatric/Behavioral: Negative.   All other systems reviewed and are negative.     Objective:    Physical Exam Vitals reviewed.  Constitutional:      Appearance: Normal appearance.  HENT:     Mouth/Throat:     Mouth: Mucous membranes are moist.  Eyes:     Pupils: Pupils are equal, round, and reactive to light.  Neck:     Vascular: No carotid bruit.  Cardiovascular:     Rate and Rhythm: Normal rate and regular rhythm.     Pulses: Normal pulses.     Heart sounds: Normal heart sounds.  Pulmonary:     Effort: Pulmonary effort is normal.     Breath sounds: Normal breath sounds.  Abdominal:     General: Bowel sounds  are normal.     Palpations: Abdomen is soft. There is no hepatomegaly, splenomegaly or mass.     Tenderness: There is no abdominal tenderness.     Hernia: No hernia is present.  Musculoskeletal:     Cervical back: Neck supple.     Right lower leg: No edema.     Left lower leg: No edema.  Skin:    Findings: No rash.  Neurological:     Mental Status: He is alert and oriented to person, place, and time.     Motor: No weakness.  Psychiatric:        Mood and Affect: Mood normal.        Behavior: Behavior normal.     BP 114/61   Pulse 87   Temp 97.8 F (36.6 C) (Oral)   Ht 5\' 11"  (1.803 m)   Wt 215 lb 6.4 oz (97.7 kg)   SpO2 96%   BMI 30.04 kg/m  Wt Readings from Last 3 Encounters:  07/22/20 215 lb 6.4 oz (97.7 kg)  04/19/20 214 lb 9.6 oz (97.3 kg)  02/12/20 218 lb 1.6 oz (98.9 kg)     Health Maintenance Due  Topic Date Due  . HEMOGLOBIN A1C  Never done  . Hepatitis C Screening  Never done  . FOOT EXAM  Never done  . OPHTHALMOLOGY EXAM  Never done  . URINE MICROALBUMIN  Never done  . PNA vac Low Risk Adult (1 of 2 - PCV13) Never done  . COVID-19 Vaccine (3 - Pfizer risk 4-dose series) 09/27/2019    There are no preventive care reminders to display for this patient.  No results found for: TSH No results found for: WBC, HGB, HCT, MCV, PLT Lab Results  Component Value Date   CREATININE 1.28 (H) 05/14/2015   No results found for: CHOL No results found for: HDL No results found for: LDLCALC No results found for: TRIG No results found for: CHOLHDL No results found for: HGBA1C    Assessment & Plan:   Problem List Items Addressed This Visit      Respiratory   Bronchitis due to parainfluenza virus    Complain of chest wall upper respiratory infection, he was raking leaves.  Chest examination does not reveal any rales or rhonchi.  He was started on Levaquin 500 mg p.o. daily for 7 days.        Endocrine   Diabetes 1.5, managed as type 2 ([place])    - The  patient's blood sugar is under control . - The patient will continue the current treatment regimen.  - I encouraged the patient to regularly check blood sugar.  - I encouraged the patient to monitor  diet. I encouraged the patient to eat low-carb and low-sugar to help prevent blood sugar spikes.  - I encouraged the patient to continue following their prescribed treatment plan for diabetes - I informed the patient to get help if blood sugar drops below 54mg /dL, or if suddenly have trouble thinking clearly or breathing.            Musculoskeletal and Integument   Osteoarthritis of cervical spine    Pt pain med refilled      Relevant Medications   HYDROcodone-acetaminophen (NORCO) 10-325 MG tablet     Other   Snoring    Not using   cpap      Dyslipidemia    Controlled on diet      Suspected COVID-19 virus infection    Covid test is negative.      Relevant Orders   POC COVID-19 (Completed)    Other Visit Diagnoses    Cough    -  Primary   Relevant Medications   levofloxacin (LEVAQUIN) 500 MG tablet   Other Relevant Orders   POC Influenza Test (POS/NEG) (Completed)      Meds ordered this encounter  Medications  . levofloxacin (LEVAQUIN) 500 MG tablet    Sig: Take 1 tablet (500 mg total) by mouth daily.    Dispense:  7 tablet    Refill:  0  . HYDROcodone-acetaminophen (NORCO) 10-325 MG tablet    Sig: Take 1 tablet by mouth every 6 (six) hours as needed.    Dispense:  60 tablet    Refill:  0    Follow-up: No follow-ups on file.    Cletis Athens, MD

## 2020-07-22 NOTE — Assessment & Plan Note (Signed)
Not using   cpap

## 2020-09-06 ENCOUNTER — Other Ambulatory Visit: Payer: Self-pay | Admitting: Internal Medicine

## 2020-09-18 ENCOUNTER — Other Ambulatory Visit: Payer: Self-pay | Admitting: Internal Medicine

## 2020-09-18 MED ORDER — INSULIN PEN NEEDLE 31G X 8 MM MISC: 1.0000 | Freq: Every day | 2 refills | Status: DC

## 2020-09-25 ENCOUNTER — Ambulatory Visit: Payer: Medicare Other | Admitting: Family Medicine

## 2020-09-30 ENCOUNTER — Ambulatory Visit (INDEPENDENT_AMBULATORY_CARE_PROVIDER_SITE_OTHER): Payer: Medicare Other | Admitting: Internal Medicine

## 2020-09-30 ENCOUNTER — Encounter: Payer: Self-pay | Admitting: Internal Medicine

## 2020-09-30 ENCOUNTER — Ambulatory Visit
Admission: RE | Admit: 2020-09-30 | Discharge: 2020-09-30 | Disposition: A | Discharge status: Home / Self Care | Payer: Medicare Other | Attending: Internal Medicine | Admitting: Internal Medicine

## 2020-09-30 ENCOUNTER — Other Ambulatory Visit: Payer: Self-pay

## 2020-09-30 ENCOUNTER — Ambulatory Visit
Admission: RE | Admit: 2020-09-30 | Discharge: 2020-09-30 | Disposition: A | Discharge status: Home / Self Care | Payer: Medicare Other | Source: Ambulatory Visit | Attending: Internal Medicine | Admitting: Internal Medicine

## 2020-09-30 VITALS — BP 107/64 | HR 97 | Ht 72.0 in | Wt 215.2 lb

## 2020-09-30 DIAGNOSIS — M25559 Pain in unspecified hip: Secondary | ICD-10-CM | POA: Diagnosis present

## 2020-09-30 DIAGNOSIS — M47812 Spondylosis without myelopathy or radiculopathy, cervical region: Secondary | ICD-10-CM

## 2020-09-30 DIAGNOSIS — E785 Hyperlipidemia, unspecified: Secondary | ICD-10-CM

## 2020-09-30 DIAGNOSIS — E139 Other specified diabetes mellitus without complications: Secondary | ICD-10-CM

## 2020-09-30 DIAGNOSIS — R0683 Snoring: Secondary | ICD-10-CM | POA: Diagnosis not present

## 2020-09-30 MED ORDER — HYDROCODONE-ACETAMINOPHEN 10-325 MG PO TABS: 1.0000 | ORAL_TABLET | Freq: Two times a day (BID) | ORAL | 0 refills | Status: DC

## 2020-09-30 NOTE — Progress Notes (Signed)
Established Patient Office Visit  Subjective:  Patient ID: Michael Ibarra, male    DOB: Jul 28, 1941  Age: 80 y.o. MRN: [id number]  CC:  Chief Complaint  Patient presents with   Back Pain    Patient here today for refill of pain medication     HPI  Michael Ibarra presents for Patient came for a general checkup.  He has problem with the pain in the back of the neck with radiculopathy going to the left shoulder.  He denies any chest pain or shortness of breath.  Patient is known to have diabetes without any complication has osteoarthritis of the cervical spine.  And has a history of dyslipidemia he takes Metformin Protonix and Toujeo, also is on statin.  He does not smoke does not drink.  Never been a smoker.  Has a previous history of back pain Patient also complaining of pain in the both hips he has a history of prostate cancer. We will take an x-ray of the both hip.  Past Medical History:  Diagnosis Date   Cancer [facility])    prostate   Diabetes mellitus without complication ([place])    Sleep apnea     Past Surgical History:  Procedure Laterality Date   BACK SURGERY     COLONOSCOPY WITH PROPOFOL N/A 09/27/2018   Procedure: COLONOSCOPY WITH PROPOFOL;  Surgeon: Lucilla Lame, MD;  Location: [facility] ENDOSCOPY;  Service: Endoscopy;  Laterality: N/A;   PROSTECTOMY      History reviewed. No pertinent family history.  Social History   Socioeconomic History   Marital status: Married    Spouse name: Not on file   Number of children: Not on file   Years of education: Not on file   Highest education level: Not on file  Occupational History   Not on file  Tobacco Use   Smoking status: Never Smoker   Smokeless tobacco: Never Used  Vaping Use   Vaping Use: Never used  Substance and Sexual Activity   Alcohol use: Never   Drug use: Never   Sexual activity: Not on file  Other Topics Concern   Not on file  Social History Narrative   Not on file   Social Determinants  of Health   Financial Resource Strain: Not on file  Food Insecurity: Not on file  Transportation Needs: Not on file  Physical Activity: Not on file  Stress: Not on file  Social Connections: Not on file  Intimate Partner Violence: Not on file     Current Outpatient Medications:    atorvastatin (LIPITOR) 40 MG tablet, Take 40 mg by mouth daily., Disp: , Rfl:    glucose blood (ONETOUCH ULTRA) test strip, Use to check blood sugar twice daily, Disp: 100 strip, Rfl: 6   HYDROcodone-acetaminophen (NORCO) 10-325 MG tablet, Take 1 tablet by mouth in the morning and at bedtime., Disp: 60 tablet, Rfl: 0   Insulin Pen Needle 31G X 8 MM MISC, 1 each by Does not apply route daily., Disp: 100 each, Rfl: 2   JANUVIA 100 MG tablet, TAKE 1 TABLET BY MOUTH DAILY, Disp: 30 tablet, Rfl: 3   levofloxacin (LEVAQUIN) 500 MG tablet, Take 1 tablet (500 mg total) by mouth daily., Disp: 7 tablet, Rfl: 0   metFORMIN (GLUCOPHAGE) 1000 MG tablet, TAKE 1 TABLET BY MOUTH TWICE DAILY, Disp: 60 tablet, Rfl: 6   metFORMIN (GLUCOPHAGE) 500 MG tablet, Take 500 mg by mouth 2 (two) times daily with a meal., Disp: , Rfl:  pantoprazole (PROTONIX) 40 MG tablet, TAKE 1 TABLET BY MOUTH DAILY, Disp: 90 tablet, Rfl: 3   TOUJEO SOLOSTAR 300 UNIT/ML Solostar Pen, INJECT 60 UNITS UNDER THE SKIN DAILY, Disp: 135 mL, Rfl: 2   Allergies  Allergen Reactions   Morphine And Related Other (See Comments)    headaches   Sulfa Antibiotics Other (See Comments)    headaches    ROS Review of Systems  Constitutional: Negative.   HENT: Negative.   Eyes: Negative.   Respiratory: Negative.   Cardiovascular: Negative.   Gastrointestinal: Negative.   Endocrine: Negative.   Genitourinary: Negative.   Musculoskeletal: Positive for back pain, neck pain and neck stiffness. Negative for gait problem.       Neck pain  Skin: Negative.   Allergic/Immunologic: Negative.   Neurological: Negative.  Negative for facial asymmetry,  light-headedness and headaches.  Hematological: Negative.   Psychiatric/Behavioral: Negative.  Negative for agitation and decreased concentration.  All other systems reviewed and are negative.     Objective:    Physical Exam Vitals reviewed.  Constitutional:      Appearance: Normal appearance.  HENT:     Mouth/Throat:     Mouth: Mucous membranes are moist.  Eyes:     Pupils: Pupils are equal, round, and reactive to light.  Neck:     Vascular: No carotid bruit.  Cardiovascular:     Rate and Rhythm: Normal rate and regular rhythm.     Pulses: Normal pulses.     Heart sounds: Normal heart sounds.  Pulmonary:     Effort: Pulmonary effort is normal.     Breath sounds: Normal breath sounds.  Abdominal:     General: Bowel sounds are normal.     Palpations: Abdomen is soft. There is no hepatomegaly, splenomegaly or mass.     Tenderness: There is no abdominal tenderness.     Hernia: No hernia is present.  Musculoskeletal:     Cervical back: Neck supple.     Right lower leg: No edema.     Left lower leg: No edema.  Skin:    Findings: No rash.  Neurological:     Mental Status: He is alert and oriented to person, place, and time.     Motor: No weakness.  Psychiatric:        Mood and Affect: Mood normal.        Behavior: Behavior normal.     BP 107/64    Pulse 97    Ht 6' (1.829 m)    Wt 215 lb 3.2 oz (97.6 kg)    BMI 29.19 kg/m  Wt Readings from Last 3 Encounters:  09/30/20 215 lb 3.2 oz (97.6 kg)  07/22/20 215 lb 6.4 oz (97.7 kg)  04/19/20 214 lb 9.6 oz (97.3 kg)     Health Maintenance Due  Topic Date Due   HEMOGLOBIN A1C  Never done   Hepatitis C Screening  Never done   FOOT EXAM  Never done   OPHTHALMOLOGY EXAM  Never done   URINE MICROALBUMIN  Never done   PNA vac Low Risk Adult (1 of 2 - PCV13) Never done   COVID-19 Vaccine (3 - Pfizer risk 4-dose series) 09/27/2019    There are no preventive care reminders to display for this patient.  No results  found for: TSH No results found for: WBC, HGB, HCT, MCV, PLT Lab Results  Component Value Date   CREATININE 1.28 (H) 05/14/2015   No results found for: CHOL No results  found for: HDL No results found for: LDLCALC No results found for: TRIG No results found for: CHOLHDL No results found for: HGBA1C    Assessment & Plan:   Problem List Items Addressed This Visit      Endocrine   Diabetes 1.5, managed as type 2 ([place]) - Primary    - The patient's blood sugar is under control on med. - The patient will continue the current treatment regimen.  - I encouraged the patient to regularly check blood sugar.  - I encouraged the patient to monitor diet. I encouraged the patient to eat low-carb and low-sugar to help prevent blood sugar spikes.  - I encouraged the patient to continue following their prescribed treatment plan for diabetes - I informed the patient to get help if blood sugar drops below 54mg /dL, or if suddenly have trouble thinking clearly or breathing.            Musculoskeletal and Integument   Osteoarthritis of cervical spine    Patient has an osteoarthritis of the hip joint back and also neck. He is known to have a prostate cancer and has been removed. I will get an x-ray of the both hip joints to further evaluate and there is no any spread of cancer to the hip. If there is any suspicion whether he may have to do a bone scan.      Relevant Medications   HYDROcodone-acetaminophen (NORCO) 10-325 MG tablet     Other   Snoring    Patient was advised to use a CPAP machine.      Dyslipidemia    Patient was advised to follow close cholesterol diet.         Meds ordered this encounter  Medications   HYDROcodone-acetaminophen (NORCO) 10-325 MG tablet    Sig: Take 1 tablet by mouth in the morning and at bedtime.    Dispense:  60 tablet    Refill:  0    Follow-up: No follow-ups on file.    Cletis Athens, MD

## 2020-09-30 NOTE — Assessment & Plan Note (Signed)

## 2020-09-30 NOTE — Addendum Note (Signed)
Addended by: Alois Cliche on: 09/30/2020 02:51 PM   Modules accepted: Orders

## 2020-09-30 NOTE — Assessment & Plan Note (Signed)
Patient was advised to follow close cholesterol diet.

## 2020-09-30 NOTE — Addendum Note (Signed)
Addended by: Alois Cliche on: 09/30/2020 02:41 PM   Modules accepted: Orders

## 2020-09-30 NOTE — Assessment & Plan Note (Signed)
Patient was advised to use a CPAP machine.

## 2020-09-30 NOTE — Assessment & Plan Note (Signed)
Patient has an osteoarthritis of the hip joint back and also neck. He is known to have a prostate cancer and has been removed. I will get an x-ray of the both hip joints to further evaluate and there is no any spread of cancer to the hip. If there is any suspicion whether he may have to do a bone scan.

## 2020-09-30 NOTE — Addendum Note (Signed)
Addended by: Alois Cliche on: 09/30/2020 02:50 PM   Modules accepted: Orders

## 2020-10-15 ENCOUNTER — Ambulatory Visit: Payer: Medicare Other | Admitting: Internal Medicine

## 2020-11-27 ENCOUNTER — Encounter: Payer: Self-pay | Admitting: Internal Medicine

## 2020-11-27 ENCOUNTER — Other Ambulatory Visit: Payer: Self-pay

## 2020-11-27 ENCOUNTER — Ambulatory Visit (INDEPENDENT_AMBULATORY_CARE_PROVIDER_SITE_OTHER): Payer: Medicare Other | Admitting: Internal Medicine

## 2020-11-27 VITALS — BP 113/74 | HR 78 | Ht 72.0 in | Wt 217.8 lb

## 2020-11-27 DIAGNOSIS — M47812 Spondylosis without myelopathy or radiculopathy, cervical region: Secondary | ICD-10-CM | POA: Diagnosis not present

## 2020-11-27 DIAGNOSIS — Z20822 Contact with and (suspected) exposure to covid-19: Secondary | ICD-10-CM | POA: Diagnosis not present

## 2020-11-27 DIAGNOSIS — E139 Other specified diabetes mellitus without complications: Secondary | ICD-10-CM

## 2020-11-27 DIAGNOSIS — J204 Acute bronchitis due to parainfluenza virus: Secondary | ICD-10-CM

## 2020-11-27 DIAGNOSIS — E785 Hyperlipidemia, unspecified: Secondary | ICD-10-CM

## 2020-11-27 LAB — POC COVID19 BINAXNOW: SARS Coronavirus 2 Ag: NEGATIVE

## 2020-11-27 MED ORDER — HYDROCODONE-ACETAMINOPHEN 10-325 MG PO TABS
1.0000 | ORAL_TABLET | Freq: Two times a day (BID) | ORAL | 0 refills | Status: DC
Start: 2020-11-27 — End: 2021-02-18

## 2020-11-27 MED ORDER — AZITHROMYCIN 250 MG PO TABS: ORAL_TABLET | ORAL | 0 refills | Status: AC

## 2020-11-27 NOTE — Progress Notes (Signed)
Established Patient Office Visit  Subjective:  Patient ID: Michael Ibarra, male    DOB: May 28, 1941  Age: 80 y.o. MRN: [id number]  CC:  Chief Complaint  Patient presents with  . URI    Patient complains of sore throat, congestion, cough and headache, no fever.     HPI  Michael Ibarra presents for sinusitis, and  Neck pain  Past Medical History:  Diagnosis Date  . Cancer [facility])    prostate  . Diabetes mellitus without complication ([place])   . Sleep apnea     Past Surgical History:  Procedure Laterality Date  . BACK SURGERY    . COLONOSCOPY WITH PROPOFOL N/A 09/27/2018   Procedure: COLONOSCOPY WITH PROPOFOL;  Surgeon: Lucilla Lame, MD;  Location: [facility] ENDOSCOPY;  Service: Endoscopy;  Laterality: N/A;  . PROSTECTOMY      History reviewed. No pertinent family history.  Social History   Socioeconomic History  . Marital status: Married    Spouse name: Not on file  . Number of children: Not on file  . Years of education: Not on file  . Highest education level: Not on file  Occupational History  . Not on file  Tobacco Use  . Smoking status: Never Smoker  . Smokeless tobacco: Never Used  Vaping Use  . Vaping Use: Never used  Substance and Sexual Activity  . Alcohol use: Never  . Drug use: Never  . Sexual activity: Not on file  Other Topics Concern  . Not on file  Social History Narrative  . Not on file   Social Determinants of Health   Financial Resource Strain: Not on file  Food Insecurity: Not on file  Transportation Needs: Not on file  Physical Activity: Not on file  Stress: Not on file  Social Connections: Not on file  Intimate Partner Violence: Not on file     Current Outpatient Medications:  .  azithromycin (ZITHROMAX) 250 MG tablet, Take 2 tablets on day 1, then 1 tablet daily on days 2 through 5, Disp: 6 tablet, Rfl: 0 .  atorvastatin (LIPITOR) 40 MG tablet, Take 40 mg by mouth daily., Disp: , Rfl:  .  glucose blood (ONETOUCH ULTRA) test strip,  Use to check blood sugar twice daily, Disp: 100 strip, Rfl: 6 .  HYDROcodone-acetaminophen (NORCO) 10-325 MG tablet, Take 1 tablet by mouth in the morning and at bedtime., Disp: 60 tablet, Rfl: 0 .  Insulin Pen Needle 31G X 8 MM MISC, 1 each by Does not apply route daily., Disp: 100 each, Rfl: 2 .  JANUVIA 100 MG tablet, TAKE 1 TABLET BY MOUTH DAILY, Disp: 30 tablet, Rfl: 3 .  levofloxacin (LEVAQUIN) 500 MG tablet, Take 1 tablet (500 mg total) by mouth daily., Disp: 7 tablet, Rfl: 0 .  metFORMIN (GLUCOPHAGE) 1000 MG tablet, TAKE 1 TABLET BY MOUTH TWICE DAILY, Disp: 60 tablet, Rfl: 6 .  metFORMIN (GLUCOPHAGE) 500 MG tablet, Take 500 mg by mouth 2 (two) times daily with a meal., Disp: , Rfl:  .  pantoprazole (PROTONIX) 40 MG tablet, TAKE 1 TABLET BY MOUTH DAILY, Disp: 90 tablet, Rfl: 3 .  TOUJEO SOLOSTAR 300 UNIT/ML Solostar Pen, INJECT 60 UNITS UNDER THE SKIN DAILY, Disp: 135 mL, Rfl: 2   Allergies  Allergen Reactions  . Morphine And Related Other (See Comments)    headaches  . Sulfa Antibiotics Other (See Comments)    headaches    ROS Review of Systems  Constitutional: Negative.   HENT: Positive for  congestion, rhinorrhea, sinus pressure, sinus pain and sneezing. Negative for ear discharge and postnasal drip.   Eyes: Negative.   Respiratory: Positive for cough. Negative for chest tightness.   Cardiovascular: Negative.  Negative for chest pain.  Gastrointestinal: Negative.   Endocrine: Negative.   Genitourinary: Negative.   Musculoskeletal: Negative.   Skin: Negative.   Allergic/Immunologic: Negative.   Neurological: Negative.  Negative for syncope and light-headedness.  Hematological: Negative.   Psychiatric/Behavioral: Negative.   All other systems reviewed and are negative.     Objective:    Physical Exam Vitals reviewed.  Constitutional:      Appearance: Normal appearance. He is normal weight. He is ill-appearing.  HENT:     Right Ear: Tympanic membrane normal.      Left Ear: Tympanic membrane normal.     Nose: Congestion present.     Mouth/Throat:     Mouth: Mucous membranes are moist.     Pharynx: Oropharyngeal exudate and posterior oropharyngeal erythema present.  Eyes:     Conjunctiva/sclera: Conjunctivae normal.     Pupils: Pupils are equal, round, and reactive to light.  Neck:     Vascular: No carotid bruit.  Cardiovascular:     Rate and Rhythm: Normal rate and regular rhythm.     Pulses: Normal pulses.     Heart sounds: Normal heart sounds.  Pulmonary:     Effort: Pulmonary effort is normal.     Breath sounds: Normal breath sounds.  Abdominal:     General: Bowel sounds are normal.     Palpations: Abdomen is soft. There is no hepatomegaly, splenomegaly or mass.     Tenderness: There is no abdominal tenderness.     Hernia: No hernia is present.  Musculoskeletal:     Cervical back: Neck supple.     Right lower leg: No edema.     Left lower leg: No edema.  Skin:    Findings: No rash.  Neurological:     Mental Status: He is alert and oriented to person, place, and time.     Motor: No weakness.  Psychiatric:        Mood and Affect: Mood normal.        Behavior: Behavior normal.     BP 113/74   Pulse 78   Ht 6' (1.829 m)   Wt 217 lb 12.8 oz (98.8 kg)   SpO2 96%   BMI 29.54 kg/m  Wt Readings from Last 3 Encounters:  11/27/20 217 lb 12.8 oz (98.8 kg)  09/30/20 215 lb 3.2 oz (97.6 kg)  07/22/20 215 lb 6.4 oz (97.7 kg)     Health Maintenance Due  Topic Date Due  . HEMOGLOBIN A1C  Never done  . Hepatitis C Screening  Never done  . FOOT EXAM  Never done  . OPHTHALMOLOGY EXAM  Never done  . URINE MICROALBUMIN  Never done  . PNA vac Low Risk Adult (1 of 2 - PCV13) Never done  . COVID-19 Vaccine (3 - Pfizer risk 4-dose series) 09/27/2019    There are no preventive care reminders to display for this patient.  No results found for: TSH No results found for: WBC, HGB, HCT, MCV, PLT Lab Results  Component Value Date    CREATININE 1.28 (H) 05/14/2015   No results found for: CHOL No results found for: HDL No results found for: LDLCALC No results found for: TRIG No results found for: CHOLHDL No results found for: HGBA1C    Assessment & Plan:  Problem List Items Addressed This Visit      Respiratory   Bronchitis due to parainfluenza virus    Patient was given azithromycin 6 tablets as directed for sinusitis      Relevant Medications   azithromycin (ZITHROMAX) 250 MG tablet     Endocrine   Diabetes 1.5, managed as type 2 ([place])    - The patient's blood sugar is under control on med. - The patient will continue the current treatment regimen.  - I encouraged the patient to regularly check blood sugar.  - I encouraged the patient to monitor diet. I encouraged the patient to eat low-carb and low-sugar to help prevent blood sugar spikes.  - I encouraged the patient to continue following their prescribed treatment plan for diabetes - I informed the patient to get help if blood sugar drops below 54mg /dL, or if suddenly have trouble thinking clearly or breathing.         Musculoskeletal and Integument   Osteoarthritis of cervical spine    Patient is on chronic pain medication for vascular arthritis of the neck with radiculopathy.      Relevant Medications   HYDROcodone-acetaminophen (NORCO) 10-325 MG tablet     Other   Dyslipidemia    Hypercholesterolemia  I advised the patient to follow Mediterranean diet This diet is rich in fruits vegetables and whole grain, and This diet is also rich in fish and lean meat Patient should also eat a handful of almonds or walnuts daily Recent heart study indicated that average follow-up on this kind of diet reduces the cardiovascular mortality by 50 to 70%==      Suspected COVID-19 virus infection - Primary    COVID test is negative.      Relevant Medications   HYDROcodone-acetaminophen (NORCO) 10-325 MG tablet   azithromycin (ZITHROMAX) 250 MG tablet    Other Relevant Orders   POC COVID-19 (Completed)      Meds ordered this encounter  Medications  . HYDROcodone-acetaminophen (NORCO) 10-325 MG tablet    Sig: Take 1 tablet by mouth in the morning and at bedtime.    Dispense:  60 tablet    Refill:  0  . azithromycin (ZITHROMAX) 250 MG tablet    Sig: Take 2 tablets on day 1, then 1 tablet daily on days 2 through 5    Dispense:  6 tablet    Refill:  0    Follow-up: No follow-ups on file.  Hip x-ray report were discussed with the patient.  Cletis Athens, MD

## 2020-11-27 NOTE — Assessment & Plan Note (Signed)
Patient is on chronic pain medication for vascular arthritis of the neck with radiculopathy.

## 2020-11-27 NOTE — Assessment & Plan Note (Signed)

## 2020-11-27 NOTE — Assessment & Plan Note (Signed)
Patient was given azithromycin 6 tablets as directed for sinusitis

## 2020-11-27 NOTE — Assessment & Plan Note (Signed)
COVID test is negative

## 2020-11-27 NOTE — Assessment & Plan Note (Signed)
Hypercholesterolemia  I advised the patient to follow Mediterranean diet This diet is rich in fruits vegetables and whole grain, and This diet is also rich in fish and lean meat Patient should also eat a handful of almonds or walnuts daily Recent heart study indicated that average follow-up on this kind of diet reduces the cardiovascular mortality by 50 to 70%== 

## 2021-01-06 ENCOUNTER — Other Ambulatory Visit: Payer: Self-pay | Admitting: Internal Medicine

## 2021-02-17 ENCOUNTER — Ambulatory Visit: Payer: Medicare Other | Admitting: Internal Medicine

## 2021-02-18 ENCOUNTER — Encounter: Payer: Self-pay | Admitting: Internal Medicine

## 2021-02-18 ENCOUNTER — Ambulatory Visit (INDEPENDENT_AMBULATORY_CARE_PROVIDER_SITE_OTHER): Payer: Medicare Other | Admitting: Internal Medicine

## 2021-02-18 ENCOUNTER — Other Ambulatory Visit: Payer: Self-pay

## 2021-02-18 VITALS — BP 130/90 | HR 85 | Ht 72.0 in | Wt 213.2 lb

## 2021-02-18 DIAGNOSIS — R0683 Snoring: Secondary | ICD-10-CM | POA: Diagnosis not present

## 2021-02-18 DIAGNOSIS — E1369 Other specified diabetes mellitus with other specified complication: Secondary | ICD-10-CM

## 2021-02-18 DIAGNOSIS — E785 Hyperlipidemia, unspecified: Secondary | ICD-10-CM

## 2021-02-18 DIAGNOSIS — Z20822 Contact with and (suspected) exposure to covid-19: Secondary | ICD-10-CM | POA: Diagnosis not present

## 2021-02-18 DIAGNOSIS — R7303 Prediabetes: Secondary | ICD-10-CM

## 2021-02-18 DIAGNOSIS — M47812 Spondylosis without myelopathy or radiculopathy, cervical region: Secondary | ICD-10-CM | POA: Diagnosis not present

## 2021-02-18 DIAGNOSIS — E139 Other specified diabetes mellitus without complications: Secondary | ICD-10-CM

## 2021-02-18 LAB — GLUCOSE, POCT (MANUAL RESULT ENTRY): POC Glucose: 140 mg/dl — AB (ref 70–99)

## 2021-02-18 MED ORDER — HYDROCODONE-ACETAMINOPHEN 10-325 MG PO TABS
1.0000 | ORAL_TABLET | Freq: Two times a day (BID) | ORAL | 0 refills | Status: DC
Start: 2021-02-18 — End: 2021-04-22

## 2021-02-18 NOTE — Assessment & Plan Note (Signed)

## 2021-02-18 NOTE — Assessment & Plan Note (Signed)
It is a chronic problem and patient needs pain medication on a regular basis.

## 2021-02-18 NOTE — Assessment & Plan Note (Signed)
Hypercholesterolemia  I advised the patient to follow Mediterranean diet This diet is rich in fruits vegetables and whole grain, and This diet is also rich in fish and lean meat Patient should also eat a handful of almonds or walnuts daily Recent heart study indicated that average follow-up on this kind of diet reduces the cardiovascular mortality by 50 to 70%== 

## 2021-02-18 NOTE — Assessment & Plan Note (Signed)
Patient was advised to use CPAP machine

## 2021-02-18 NOTE — Progress Notes (Signed)
Established Patient Office Visit  Subjective:  Patient ID: Michael Ibarra, male    DOB: [birth year]  Age: 80 y.o. MRN: [id number]  CC:  Chief Complaint  Patient presents with   Back Pain    Patient here today for refill of pain medication    Diabetes    Back Pain  Diabetes   Michael Ibarra presents for neck pain  Past Medical History:  Diagnosis Date   Cancer [facility])    prostate   Diabetes mellitus without complication ([place])    Sleep apnea     Past Surgical History:  Procedure Laterality Date   BACK SURGERY     COLONOSCOPY WITH PROPOFOL N/A 09/27/2018   Procedure: COLONOSCOPY WITH PROPOFOL;  Surgeon: Lucilla Lame, MD;  Location: [facility] ENDOSCOPY;  Service: Endoscopy;  Laterality: N/A;   PROSTECTOMY      No family history on file.  Social History   Socioeconomic History   Marital status: Married    Spouse name: Not on file   Number of children: Not on file   Years of education: Not on file   Highest education level: Not on file  Occupational History   Not on file  Tobacco Use   Smoking status: Never   Smokeless tobacco: Never  Vaping Use   Vaping Use: Never used  Substance and Sexual Activity   Alcohol use: Never   Drug use: Never   Sexual activity: Not on file  Other Topics Concern   Not on file  Social History Narrative   Not on file   Social Determinants of Health   Financial Resource Strain: Not on file  Food Insecurity: Not on file  Transportation Needs: Not on file  Physical Activity: Not on file  Stress: Not on file  Social Connections: Not on file  Intimate Partner Violence: Not on file     Current Outpatient Medications:    atorvastatin (LIPITOR) 40 MG tablet, Take 40 mg by mouth daily., Disp: , Rfl:    glucose blood (ONETOUCH ULTRA) test strip, Use to check blood sugar twice daily, Disp: 100 strip, Rfl: 6   HYDROcodone-acetaminophen (NORCO) 10-325 MG tablet, Take 1 tablet by mouth in the morning and at bedtime., Disp: 60 tablet, Rfl:  0   Insulin Pen Needle 31G X 8 MM MISC, 1 each by Does not apply route daily., Disp: 100 each, Rfl: 2   JANUVIA 100 MG tablet, TAKE 1 TABLET BY MOUTH DAILY, Disp: 30 tablet, Rfl: 3   levofloxacin (LEVAQUIN) 500 MG tablet, Take 1 tablet (500 mg total) by mouth daily., Disp: 7 tablet, Rfl: 0   metFORMIN (GLUCOPHAGE) 1000 MG tablet, TAKE 1 TABLET BY MOUTH TWICE DAILY, Disp: 60 tablet, Rfl: 6   metFORMIN (GLUCOPHAGE) 500 MG tablet, Take 500 mg by mouth 2 (two) times daily with a meal., Disp: , Rfl:    pantoprazole (PROTONIX) 40 MG tablet, TAKE 1 TABLET BY MOUTH DAILY, Disp: 90 tablet, Rfl: 3   TOUJEO SOLOSTAR 300 UNIT/ML Solostar Pen, INJECT 60 UNITS UNDER THE SKIN DAILY, Disp: 135 mL, Rfl: 2   Allergies  Allergen Reactions   Morphine And Related Other (See Comments)    headaches   Sulfa Antibiotics Other (See Comments)    headaches    ROS Review of Systems  Constitutional: Negative.   HENT: Negative.    Eyes: Negative.   Respiratory: Negative.    Cardiovascular: Negative.   Gastrointestinal: Negative.   Endocrine: Negative.   Genitourinary: Negative.   Musculoskeletal:  Positive for back pain.  Skin: Negative.   Allergic/Immunologic: Negative.   Neurological: Negative.   Hematological: Negative.   Psychiatric/Behavioral: Negative.    All other systems reviewed and are negative.    Objective:    Physical Exam Vitals reviewed.  Constitutional:      Appearance: Normal appearance.  HENT:     Mouth/Throat:     Mouth: Mucous membranes are moist.  Eyes:     Pupils: Pupils are equal, round, and reactive to light.  Neck:     Vascular: No carotid bruit.  Cardiovascular:     Rate and Rhythm: Normal rate and regular rhythm.     Pulses: Normal pulses.     Heart sounds: Normal heart sounds.  Pulmonary:     Effort: Pulmonary effort is normal.     Breath sounds: Normal breath sounds.  Abdominal:     General: Bowel sounds are normal.     Palpations: Abdomen is soft. There is no  hepatomegaly, splenomegaly or mass.     Tenderness: There is no abdominal tenderness.     Hernia: No hernia is present.  Musculoskeletal:     Cervical back: Neck supple.     Right lower leg: No edema.     Left lower leg: No edema.  Skin:    Findings: No rash.  Neurological:     Mental Status: He is alert and oriented to person, place, and time.     Motor: No weakness.  Psychiatric:        Mood and Affect: Mood normal.        Behavior: Behavior normal.    BP 130/90   Pulse 85   Ht 6' (1.829 m)   Wt 213 lb 3.2 oz (96.7 kg)   BMI 28.92 kg/m  Wt Readings from Last 3 Encounters:  02/18/21 213 lb 3.2 oz (96.7 kg)  11/27/20 217 lb 12.8 oz (98.8 kg)  09/30/20 215 lb 3.2 oz (97.6 kg)     Health Maintenance Due  Topic Date Due   HEMOGLOBIN A1C  Never done   FOOT EXAM  Never done   OPHTHALMOLOGY EXAM  Never done   URINE MICROALBUMIN  Never done   Hepatitis C Screening  Never done   Zoster Vaccines- Shingrix (1 of 2) Never done   PNA vac Low Risk Adult (2 of 2 - PPSV23) 06/24/2016   COVID-19 Vaccine (4 - Booster for Pfizer series) 08/30/2020    There are no preventive care reminders to display for this patient.  No results found for: TSH No results found for: WBC, HGB, HCT, MCV, PLT Lab Results  Component Value Date   CREATININE 1.28 (H) 05/14/2015   No results found for: CHOL No results found for: HDL No results found for: LDLCALC No results found for: TRIG No results found for: CHOLHDL No results found for: HGBA1C    Assessment & Plan:   Problem List Items Addressed This Visit       Endocrine   Diabetes 1.5, managed as type 2 ([place]) - Primary    - The patient's blood sugar is labile on med. - The patient will continue the current treatment regimen.  - I encouraged the patient to regularly check blood sugar.  - I encouraged the patient to monitor diet. I encouraged the patient to eat low-carb and low-sugar to help prevent blood sugar spikes.  - I encouraged  the patient to continue following their prescribed treatment plan for diabetes - I informed the patient to  get help if blood sugar drops below 54mg /dL, or if suddenly have trouble thinking clearly or breathing.  Patient was advised to buy a book on diabetes from a local bookstore or from [place]  Patient should read 2 chapters every day to keep the motivation going, this is in addition to some of the materials we provided them from the office.  There are other resources on the Internet like YouTube and wilkipedia to get an education on the diabetes       Relevant Orders   POCT glucose (manual entry) (Completed)     Musculoskeletal and Integument   Osteoarthritis of cervical spine    It is a chronic problem and patient needs pain medication on a regular basis.         Other   Snoring    Patient was advised to use CPAP machine       Dyslipidemia    Hypercholesterolemia  I advised the patient to follow Mediterranean diet This diet is rich in fruits vegetables and whole grain, and This diet is also rich in fish and lean meat Patient should also eat a handful of almonds or walnuts daily Recent heart study indicated that average follow-up on this kind of diet reduces the cardiovascular mortality by 50 to 70%==       Prediabetes    - The patient's blood sugar is labile on med. - The patient will continue the current treatment regimen.  - I encouraged the patient to regularly check blood sugar.  - I encouraged the patient to monitor diet. I encouraged the patient to eat low-carb and low-sugar to help prevent blood sugar spikes.  - I encouraged the patient to continue following their prescribed treatment plan for diabetes - I informed the patient to get help if blood sugar drops below 54mg /dL, or if suddenly have trouble thinking clearly or breathing.  Patient was advised to buy a book on diabetes from a local bookstore or from [place]  Patient should read 2 chapters every day to keep  the motivation going, this is in addition to some of the materials we provided them from the office.  There are other resources on the Internet like YouTube and wilkipedia to get an education on the diabetes       Suspected COVID-19 virus infection    No orders of the defined types were placed in this encounter.   Follow-up: No follow-ups on file.    Cletis Athens, MD

## 2021-04-10 LAB — HEMOGLOBIN A1C: Hemoglobin A1C: 7.5

## 2021-04-22 ENCOUNTER — Ambulatory Visit (INDEPENDENT_AMBULATORY_CARE_PROVIDER_SITE_OTHER): Payer: Medicare Other | Admitting: Internal Medicine

## 2021-04-22 ENCOUNTER — Encounter: Payer: Self-pay | Admitting: Internal Medicine

## 2021-04-22 ENCOUNTER — Other Ambulatory Visit: Payer: Self-pay

## 2021-04-22 VITALS — BP 115/68 | HR 79 | Ht 72.0 in | Wt 223.1 lb

## 2021-04-22 DIAGNOSIS — Z20822 Contact with and (suspected) exposure to covid-19: Secondary | ICD-10-CM

## 2021-04-22 DIAGNOSIS — M47812 Spondylosis without myelopathy or radiculopathy, cervical region: Secondary | ICD-10-CM

## 2021-04-22 DIAGNOSIS — R0683 Snoring: Secondary | ICD-10-CM

## 2021-04-22 DIAGNOSIS — R7303 Prediabetes: Secondary | ICD-10-CM | POA: Diagnosis not present

## 2021-04-22 DIAGNOSIS — E139 Other specified diabetes mellitus without complications: Secondary | ICD-10-CM | POA: Diagnosis not present

## 2021-04-22 DIAGNOSIS — E785 Hyperlipidemia, unspecified: Secondary | ICD-10-CM | POA: Diagnosis not present

## 2021-04-22 LAB — GLUCOSE, POCT (MANUAL RESULT ENTRY): POC Glucose: 191 mg/dl — AB (ref 70–99)

## 2021-04-22 MED ORDER — METFORMIN HCL 1000 MG PO TABS: 1000.0000 mg | ORAL_TABLET | Freq: Two times a day (BID) | ORAL | 3 refills | Status: DC

## 2021-04-22 MED ORDER — SITAGLIPTIN PHOSPHATE 100 MG PO TABS: 100.0000 mg | ORAL_TABLET | Freq: Every day | ORAL | 3 refills | Status: DC

## 2021-04-22 MED ORDER — HYDROCODONE-ACETAMINOPHEN 10-325 MG PO TABS
1.0000 | ORAL_TABLET | Freq: Two times a day (BID) | ORAL | 0 refills | Status: DC
Start: 2021-04-22 — End: 2021-06-16

## 2021-04-22 NOTE — Assessment & Plan Note (Signed)

## 2021-04-22 NOTE — Assessment & Plan Note (Signed)
Hypercholesterolemia  I advised the patient to follow Mediterranean diet This diet is rich in fruits vegetables and whole grain, and This diet is also rich in fish and lean meat Patient should also eat a handful of almonds or walnuts daily Recent heart study indicated that average follow-up on this kind of diet reduces the cardiovascular mortality by 50 to 70%== 

## 2021-04-22 NOTE — Assessment & Plan Note (Signed)
Chronic problem patient was advised to do back exercises

## 2021-04-22 NOTE — Assessment & Plan Note (Addendum)
Patient has a snoring problem from the last 10 to 15 years.  He is not using his CPAP machine but he is getting worse right now so want to do get a sleep study sleep Quality is very poor at the present time he complains of choking gasping and apneas at night.  Also has a more sleepiness during the day with morning and afternoon headache. Patient usually goes to sleep around 10:00 and will wake up early in the morning sometimes 1 AM and stays awake.  He also complains of cramping of the legs There is no behavior problem patient denies any history of psychiatric problem.

## 2021-04-22 NOTE — Progress Notes (Addendum)
Established Patient Office Visit  Subjective:  Patient ID: Michael Ibarra, male    DOB: 1941-05-18  Age: 80 y.o. MRN: [id number]  CC:  Chief Complaint  Patient presents with   Snoring    Patient requesting sleep study for snoring and daytime sleepiness. Sleep study ordered today. Patient would like to request med refill.     HPI  Michael Ibarra presents for pain in the neck patient has a severe osteoarthritis of the cervical spine  Past Medical History:  Diagnosis Date   Cancer [facility])    prostate   Diabetes mellitus without complication ([place])    Sleep apnea     Past Surgical History:  Procedure Laterality Date   BACK SURGERY     COLONOSCOPY WITH PROPOFOL N/A 09/27/2018   Procedure: COLONOSCOPY WITH PROPOFOL;  Surgeon: Lucilla Lame, MD;  Location: [facility] ENDOSCOPY;  Service: Endoscopy;  Laterality: N/A;   PROSTECTOMY      History reviewed. No pertinent family history.  Social History   Socioeconomic History   Marital status: Married    Spouse name: Not on file   Number of children: Not on file   Years of education: Not on file   Highest education level: Not on file  Occupational History   Not on file  Tobacco Use   Smoking status: Never   Smokeless tobacco: Never  Vaping Use   Vaping Use: Never used  Substance and Sexual Activity   Alcohol use: Never   Drug use: Never   Sexual activity: Not on file  Other Topics Concern   Not on file  Social History Narrative   Not on file   Social Determinants of Health   Financial Resource Strain: Low Risk    Difficulty of Paying Living Expenses: Not hard at all  Food Insecurity: No Food Insecurity   Worried About Running Out of Food in the Last Year: Never true   [place] in the Last Year: Never true  Transportation Needs: No Transportation Needs   Lack of Transportation (Medical): No   Lack of Transportation (Non-Medical): No  Physical Activity: Sufficiently Active   Days of Exercise per Week: 5 days    Minutes of Exercise per Session: 30 min  Stress: No Stress Concern Present   Feeling of Stress : Not at all  Social Connections: Moderately Integrated   Frequency of Communication with Friends and Family: More than three times a week   Frequency of Social Gatherings with Friends and Family: More than three times a week   Attends Religious Services: More than 4 times per year   Active Member of [employer] or Organizations: No   Attends Archivist Meetings: Never   Marital Status: Married  Human resources officer Violence: Not At Risk   Fear of Current or Ex-Partner: No   Emotionally Abused: No   Physically Abused: No   Sexually Abused: No     Current Outpatient Medications:    aspirin 81 MG chewable tablet, Chew by mouth., Disp: , Rfl:    atorvastatin (LIPITOR) 20 MG tablet, Take by mouth., Disp: , Rfl:    atorvastatin (LIPITOR) 40 MG tablet, Take 1 tablet (40 mg total) by mouth daily., Disp: 90 tablet, Rfl: 3   Cholecalciferol 50 MCG (2000 UT) TABS, Take by mouth., Disp: , Rfl:    glucose blood (ONETOUCH ULTRA) test strip, Use to check blood sugar twice daily, Disp: 100 strip, Rfl: 6   HYDROcodone-acetaminophen (NORCO) 10-325 MG tablet, Take 1  tablet by mouth in the morning and at bedtime., Disp: 60 tablet, Rfl: 0   HYDROcodone-acetaminophen (NORCO) 10-325 MG tablet, Take by mouth., Disp: , Rfl:    insulin glargine, 1 Unit Dial, (TOUJEO SOLOSTAR) 300 UNIT/ML Solostar Pen, INJECT 60 UNITS UNDER THE SKIN DAILY, Disp: 135 mL, Rfl: 2   insulin glargine, 1 Unit Dial, (TOUJEO) 300 UNIT/ML Solostar Pen, Inject into the skin., Disp: , Rfl:    Insulin Pen Needle 31G X 8 MM MISC, 1 each by Does not apply route daily., Disp: 100 each, Rfl: 2   metFORMIN (GLUCOPHAGE) 1000 MG tablet, TAKE 1 TABLET BY MOUTH TWICE DAILY, Disp: 60 tablet, Rfl: 6   metFORMIN (GLUCOPHAGE) 1000 MG tablet, Take by mouth., Disp: , Rfl:    metFORMIN (GLUCOPHAGE) 500 MG tablet, Take 500 mg by mouth 2 (two) times daily with a  meal., Disp: , Rfl:    pantoprazole (PROTONIX) 40 MG tablet, Take 1 tablet (40 mg total) by mouth daily., Disp: 90 tablet, Rfl: 3   pantoprazole (PROTONIX) 40 MG tablet, Take 1 tablet by mouth daily., Disp: , Rfl:    sitaGLIPtin (JANUVIA) 100 MG tablet, Take 1 tablet (100 mg total) by mouth daily., Disp: 90 tablet, Rfl: 3   sitaGLIPtin (JANUVIA) 100 MG tablet, Take 1 tablet by mouth daily., Disp: , Rfl:    Allergies  Allergen Reactions   Morphine Other (See Comments)   Morphine And Related Other (See Comments)    headaches   Oxycodone Other (See Comments)    Other reaction(s): Hallucination    Sulfa Antibiotics Other (See Comments), Hives and Itching    headaches    ROS Review of Systems  Constitutional: Negative.   HENT: Negative.  Negative for hearing loss, mouth sores, postnasal drip, sinus pain and sneezing.   Eyes: Negative.  Negative for photophobia and itching.  Respiratory: Negative.  Negative for cough and choking.   Cardiovascular:  Negative for leg swelling.  Gastrointestinal: Negative.   Endocrine: Negative.   Genitourinary: Negative.   Musculoskeletal: Negative.   Skin: Negative.   Allergic/Immunologic: Negative.   Neurological: Negative.  Negative for syncope and speech difficulty.  Hematological: Negative.  Negative for adenopathy.  Psychiatric/Behavioral: Negative.  Negative for agitation and behavioral problems.   All other systems reviewed and are negative.    Objective:    Physical Exam Vitals reviewed.  Constitutional:      Appearance: Normal appearance.  HENT:     Mouth/Throat:     Mouth: Mucous membranes are moist.  Eyes:     Pupils: Pupils are equal, round, and reactive to light.  Neck:     Vascular: No carotid bruit.  Cardiovascular:     Rate and Rhythm: Normal rate and regular rhythm.     Pulses: Normal pulses.     Heart sounds: Normal heart sounds.  Pulmonary:     Effort: Pulmonary effort is normal.     Breath sounds: Normal breath  sounds.  Abdominal:     General: Bowel sounds are normal.     Palpations: Abdomen is soft. There is no hepatomegaly, splenomegaly or mass.     Tenderness: There is no abdominal tenderness.     Hernia: No hernia is present.  Musculoskeletal:     Cervical back: Neck supple.     Right lower leg: No edema.     Left lower leg: No edema.     Comments: Painful movement of the neck  Skin:    Findings: No rash.  Neurological:  Mental Status: He is alert and oriented to person, place, and time.     Motor: No weakness.  Psychiatric:        Mood and Affect: Mood normal.        Behavior: Behavior normal.    BP 115/68   Pulse 79   Ht 6' (1.829 m)   Wt 223 lb 1.6 oz (101.2 kg)   BMI 30.26 kg/m  Wt Readings from Last 3 Encounters:  07/07/21 221 lb 4.8 oz (100.4 kg)  06/16/21 222 lb (100.7 kg)  04/22/21 223 lb 1.6 oz (101.2 kg)     Health Maintenance Due  Topic Date Due   OPHTHALMOLOGY EXAM  Never done   Hepatitis C Screening  Never done   Pneumonia Vaccine 80+ Years old (2 - PPSV23 if available, else PCV20) 06/24/2016   COVID-19 Vaccine (4 - Booster for Pfizer series) 07/25/2020    There are no preventive care reminders to display for this patient.  No results found for: TSH No results found for: WBC, HGB, HCT, MCV, PLT Lab Results  Component Value Date   CREATININE 1.28 (H) 05/14/2015   No results found for: CHOL No results found for: HDL No results found for: LDLCALC No results found for: TRIG No results found for: CHOLHDL Lab Results  Component Value Date   HGBA1C 7.5 04/10/2021      Assessment & Plan:   Problem List Items Addressed This Visit       Endocrine   Diabetes 1.5, managed as type 2 ([place]) - Primary    - The patient's blood sugar is labile on med. - The patient will continue the current treatment regimen.  - I encouraged the patient to regularly check blood sugar.  - I encouraged the patient to monitor diet. I encouraged the patient to eat  low-carb and low-sugar to help prevent blood sugar spikes.  - I encouraged the patient to continue following their prescribed treatment plan for diabetes - I informed the patient to get help if blood sugar drops below 54mg /dL, or if suddenly have trouble thinking clearly or breathing.  Patient was advised to buy a book on diabetes from a local bookstore or from [place]  Patient should read 2 chapters every day to keep the motivation going, this is in addition to some of the materials we provided them from the office.  There are other resources on the Internet like YouTube and wilkipedia to get an education on the diabetes      Relevant Orders   POCT glucose (manual entry) (Completed)     Musculoskeletal and Integument   Osteoarthritis of cervical spine    Chronic problem patient was advised to do back exercises      Relevant Orders   POCT glucose (manual entry) (Completed)     Other   Snoring    Patient has a snoring problem from the last 10 to 15 years.  He is not using his CPAP machine but he is getting worse right now so want to do get a sleep study sleep Quality is very poor at the present time he complains of choking gasping and apneas at night.  Also has a more sleepiness during the day with morning and afternoon headache. Patient usually goes to sleep around 10:00 and will wake up early in the morning sometimes 1 AM and stays awake.  He also complains of cramping of the legs There is no behavior problem patient denies any history of psychiatric problem.  Dyslipidemia    Hypercholesterolemia  I advised the patient to follow Mediterranean diet This diet is rich in fruits vegetables and whole grain, and This diet is also rich in fish and lean meat Patient should also eat a handful of almonds or walnuts daily Recent heart study indicated that average follow-up on this kind of diet reduces the cardiovascular mortality by 50 to 70%==      Prediabetes    - The patient's blood  sugar is labile on med. - The patient will continue the current treatment regimen.  - I encouraged the patient to regularly check blood sugar.  - I encouraged the patient to monitor diet. I encouraged the patient to eat low-carb and low-sugar to help prevent blood sugar spikes.  - I encouraged the patient to continue following their prescribed treatment plan for diabetes - I informed the patient to get help if blood sugar drops below 54mg /dL, or if suddenly have trouble thinking clearly or breathing.  Patient was advised to buy a book on diabetes from a local bookstore or from [place]  Patient should read 2 chapters every day to keep the motivation going, this is in addition to some of the materials we provided them from the office.  There are other resources on the Internet like YouTube and wilkipedia to get an education on the diabetes      Suspected COVID-19 virus infection    Meds ordered this encounter  Medications   DISCONTD: HYDROcodone-acetaminophen (NORCO) 10-325 MG tablet    Sig: Take 1 tablet by mouth in the morning and at bedtime.    Dispense:  60 tablet    Refill:  0   DISCONTD: sitaGLIPtin (JANUVIA) 100 MG tablet    Sig: Take 1 tablet (100 mg total) by mouth daily.    Dispense:  90 tablet    Refill:  3   DISCONTD: metFORMIN (GLUCOPHAGE) 1000 MG tablet    Sig: Take 1 tablet (1,000 mg total) by mouth 2 (two) times daily.    Dispense:  180 tablet    Refill:  3    Follow-up: No follow-ups on file.    Cletis Athens, MD

## 2021-04-22 NOTE — Addendum Note (Signed)
Addended by: Alois Cliche on: 04/22/2021 04:28 PM   Modules accepted: Orders

## 2021-04-28 ENCOUNTER — Other Ambulatory Visit: Payer: Self-pay | Admitting: *Deleted

## 2021-04-28 DIAGNOSIS — Z20822 Contact with and (suspected) exposure to covid-19: Secondary | ICD-10-CM

## 2021-04-28 DIAGNOSIS — M47812 Spondylosis without myelopathy or radiculopathy, cervical region: Secondary | ICD-10-CM

## 2021-04-29 ENCOUNTER — Other Ambulatory Visit: Payer: Self-pay | Admitting: *Deleted

## 2021-04-30 MED ORDER — TOUJEO SOLOSTAR 300 UNIT/ML ~~LOC~~ SOPN
PEN_INJECTOR | SUBCUTANEOUS | 2 refills | Status: AC
Start: 2021-04-30 — End: ?

## 2021-04-30 MED ORDER — PANTOPRAZOLE SODIUM 40 MG PO TBEC: 40.0000 mg | DELAYED_RELEASE_TABLET | Freq: Every day | ORAL | 3 refills | Status: AC

## 2021-04-30 MED ORDER — SITAGLIPTIN PHOSPHATE 100 MG PO TABS: 100.0000 mg | ORAL_TABLET | Freq: Every day | ORAL | 3 refills | Status: AC

## 2021-04-30 MED ORDER — ATORVASTATIN CALCIUM 40 MG PO TABS: 40.0000 mg | ORAL_TABLET | Freq: Every day | ORAL | 3 refills | Status: DC

## 2021-05-05 ENCOUNTER — Other Ambulatory Visit: Payer: Self-pay | Admitting: *Deleted

## 2021-05-05 MED ORDER — ATORVASTATIN CALCIUM 40 MG PO TABS: 40.0000 mg | ORAL_TABLET | Freq: Every day | ORAL | 3 refills | Status: DC

## 2021-05-15 DIAGNOSIS — E0800 Diabetes mellitus due to underlying condition with hyperosmolarity without nonketotic hyperglycemic-hyperosmolar coma (NKHHC): Secondary | ICD-10-CM | POA: Insufficient documentation

## 2021-05-15 DIAGNOSIS — I739 Peripheral vascular disease, unspecified: Secondary | ICD-10-CM | POA: Insufficient documentation

## 2021-05-15 DIAGNOSIS — Z794 Long term (current) use of insulin: Secondary | ICD-10-CM | POA: Insufficient documentation

## 2021-05-15 DIAGNOSIS — E785 Hyperlipidemia, unspecified: Secondary | ICD-10-CM | POA: Insufficient documentation

## 2021-05-20 LAB — MICROALBUMIN, URINE: Microalb, Ur: NORMAL

## 2021-05-21 ENCOUNTER — Other Ambulatory Visit: Payer: Self-pay

## 2021-06-15 ENCOUNTER — Other Ambulatory Visit: Payer: Self-pay | Admitting: Internal Medicine

## 2021-06-16 ENCOUNTER — Encounter: Payer: Self-pay | Admitting: Internal Medicine

## 2021-06-16 ENCOUNTER — Ambulatory Visit (INDEPENDENT_AMBULATORY_CARE_PROVIDER_SITE_OTHER): Payer: Medicare Other | Admitting: Internal Medicine

## 2021-06-16 ENCOUNTER — Other Ambulatory Visit: Payer: Self-pay

## 2021-06-16 VITALS — BP 109/66 | HR 74 | Ht 72.0 in | Wt 222.0 lb

## 2021-06-16 DIAGNOSIS — M47812 Spondylosis without myelopathy or radiculopathy, cervical region: Secondary | ICD-10-CM

## 2021-06-16 DIAGNOSIS — R7303 Prediabetes: Secondary | ICD-10-CM | POA: Diagnosis not present

## 2021-06-16 DIAGNOSIS — E785 Hyperlipidemia, unspecified: Secondary | ICD-10-CM

## 2021-06-16 DIAGNOSIS — R0683 Snoring: Secondary | ICD-10-CM | POA: Diagnosis not present

## 2021-06-16 MED ORDER — HYDROCODONE-ACETAMINOPHEN 10-325 MG PO TABS: 1.0000 | ORAL_TABLET | Freq: Two times a day (BID) | ORAL | 0 refills | Status: AC

## 2021-06-16 NOTE — Assessment & Plan Note (Signed)
Patient was advised to lose weight 

## 2021-06-16 NOTE — Assessment & Plan Note (Signed)

## 2021-06-16 NOTE — Assessment & Plan Note (Signed)
Suggest neck brace as needed

## 2021-06-16 NOTE — Progress Notes (Signed)
Established Patient Office Visit  Subjective:  Patient ID: Michael Ibarra, male    DOB: 1940-10-01  Age: 80 y.o. MRN: [id number]  CC:  Chief Complaint  Patient presents with   Osteoarthritis    Patient here for refill of pain med     HPI  Michael Ibarra presents for c/o pain in neck and  lower back , no chest pain  Past Medical History:  Diagnosis Date   Cancer [facility])    prostate   Diabetes mellitus without complication ([place])    Sleep apnea     Past Surgical History:  Procedure Laterality Date   BACK SURGERY     COLONOSCOPY WITH PROPOFOL N/A 09/27/2018   Procedure: COLONOSCOPY WITH PROPOFOL;  Surgeon: Lucilla Lame, MD;  Location: [facility] ENDOSCOPY;  Service: Endoscopy;  Laterality: N/A;   PROSTECTOMY      History reviewed. No pertinent family history.  Social History   Socioeconomic History   Marital status: Married    Spouse name: Not on file   Number of children: Not on file   Years of education: Not on file   Highest education level: Not on file  Occupational History   Not on file  Tobacco Use   Smoking status: Never   Smokeless tobacco: Never  Vaping Use   Vaping Use: Never used  Substance and Sexual Activity   Alcohol use: Never   Drug use: Never   Sexual activity: Not on file  Other Topics Concern   Not on file  Social History Narrative   Not on file   Social Determinants of Health   Financial Resource Strain: Not on file  Food Insecurity: Not on file  Transportation Needs: Not on file  Physical Activity: Not on file  Stress: Not on file  Social Connections: Not on file  Intimate Partner Violence: Not on file     Current Outpatient Medications:    atorvastatin (LIPITOR) 40 MG tablet, Take 1 tablet (40 mg total) by mouth daily., Disp: 90 tablet, Rfl: 3   glucose blood (ONETOUCH ULTRA) test strip, Use to check blood sugar twice daily, Disp: 100 strip, Rfl: 6   HYDROcodone-acetaminophen (NORCO) 10-325 MG tablet, Take 1 tablet by mouth in the  morning and at bedtime., Disp: 60 tablet, Rfl: 0   insulin glargine, 1 Unit Dial, (TOUJEO SOLOSTAR) 300 UNIT/ML Solostar Pen, INJECT 60 UNITS UNDER THE SKIN DAILY, Disp: 135 mL, Rfl: 2   Insulin Pen Needle 31G X 8 MM MISC, 1 each by Does not apply route daily., Disp: 100 each, Rfl: 2   metFORMIN (GLUCOPHAGE) 1000 MG tablet, TAKE 1 TABLET BY MOUTH TWICE DAILY, Disp: 60 tablet, Rfl: 6   metFORMIN (GLUCOPHAGE) 500 MG tablet, Take 500 mg by mouth 2 (two) times daily with a meal., Disp: , Rfl:    pantoprazole (PROTONIX) 40 MG tablet, Take 1 tablet (40 mg total) by mouth daily., Disp: 90 tablet, Rfl: 3   sitaGLIPtin (JANUVIA) 100 MG tablet, Take 1 tablet (100 mg total) by mouth daily., Disp: 90 tablet, Rfl: 3   Allergies  Allergen Reactions   Morphine And Related Other (See Comments)    headaches   Sulfa Antibiotics Other (See Comments)    headaches    ROS Review of Systems  Respiratory: Negative.    Cardiovascular: Negative.   Gastrointestinal: Negative.   Endocrine: Negative.   Genitourinary: Negative.   Musculoskeletal:  Positive for arthralgias, back pain and neck pain.  Psychiatric/Behavioral:  Negative for agitation.  Objective:    Physical Exam Musculoskeletal:     Cervical back: Bony tenderness present. Pain with movement present.       Back:     Comments: Epidural  6 months ago    BP 109/66   Pulse 74   Ht 6' (1.829 m)   Wt 222 lb (100.7 kg)   BMI 30.11 kg/m  Wt Readings from Last 3 Encounters:  06/16/21 222 lb (100.7 kg)  04/22/21 223 lb 1.6 oz (101.2 kg)  02/18/21 213 lb 3.2 oz (96.7 kg)     Health Maintenance Due  Topic Date Due   FOOT EXAM  Never done   OPHTHALMOLOGY EXAM  Never done   Hepatitis C Screening  Never done   Zoster Vaccines- Shingrix (1 of 2) Never done   Pneumonia Vaccine 59+ Years old (2 - PPSV23 if available, else PCV20) 06/24/2016   COVID-19 Vaccine (4 - Booster for Pfizer series) 07/25/2020    There are no preventive care  reminders to display for this patient.  No results found for: TSH No results found for: WBC, HGB, HCT, MCV, PLT Lab Results  Component Value Date   CREATININE 1.28 (H) 05/14/2015   No results found for: CHOL No results found for: HDL No results found for: LDLCALC No results found for: TRIG No results found for: CHOLHDL Lab Results  Component Value Date   HGBA1C 7.5 04/10/2021      Assessment & Plan:   Problem List Items Addressed This Visit       Musculoskeletal and Integument   Osteoarthritis of cervical spine    Suggest neck brace as needed      Relevant Medications   HYDROcodone-acetaminophen (NORCO) 10-325 MG tablet     Other   Snoring    Patient was advised to lose weight      Dyslipidemia - Primary    Hypercholesterolemia  I advised the patient to follow Mediterranean diet This diet is rich in fruits vegetables and whole grain, and This diet is also rich in fish and lean meat Patient should also eat a handful of almonds or walnuts daily Recent heart study indicated that average follow-up on this kind of diet reduces the cardiovascular mortality by 50 to 70%==      Prediabetes    - The patient's blood sugar is labile on med. - The patient will continue the current treatment regimen.  - I encouraged the patient to regularly check blood sugar.  - I encouraged the patient to monitor diet. I encouraged the patient to eat low-carb and low-sugar to help prevent blood sugar spikes.  - I encouraged the patient to continue following their prescribed treatment plan for diabetes - I informed the patient to get help if blood sugar drops below 54mg /dL, or if suddenly have trouble thinking clearly or breathing.  Patient was advised to buy a book on diabetes from a local bookstore or from [place]  Patient should read 2 chapters every day to keep the motivation going, this is in addition to some of the materials we provided them from the office.  There are other resources  on the Internet like YouTube and wilkipedia to get an education on the diabetes       Meds ordered this encounter  Medications   HYDROcodone-acetaminophen (NORCO) 10-325 MG tablet    Sig: Take 1 tablet by mouth in the morning and at bedtime.    Dispense:  60 tablet    Refill:  0  Follow-up: No follow-ups on file.    Cletis Athens, MD

## 2021-06-16 NOTE — Assessment & Plan Note (Signed)
Hypercholesterolemia  I advised the patient to follow Mediterranean diet This diet is rich in fruits vegetables and whole grain, and This diet is also rich in fish and lean meat Patient should also eat a handful of almonds or walnuts daily Recent heart study indicated that average follow-up on this kind of diet reduces the cardiovascular mortality by 50 to 70%== 

## 2021-06-18 ENCOUNTER — Other Ambulatory Visit: Payer: Self-pay | Admitting: Internal Medicine

## 2021-06-20 ENCOUNTER — Ambulatory Visit (INDEPENDENT_AMBULATORY_CARE_PROVIDER_SITE_OTHER): Payer: Medicare Other | Admitting: *Deleted

## 2021-06-20 DIAGNOSIS — Z0000 Encounter for general adult medical examination without abnormal findings: Secondary | ICD-10-CM | POA: Diagnosis not present

## 2021-06-20 DIAGNOSIS — Z0100 Encounter for examination of eyes and vision without abnormal findings: Secondary | ICD-10-CM

## 2021-06-20 DIAGNOSIS — E119 Type 2 diabetes mellitus without complications: Secondary | ICD-10-CM

## 2021-06-20 NOTE — Progress Notes (Signed)
Subjective:   Michael Ibarra is a 80 y.o. male who presents for Medicare Annual/Subsequent preventive examination.  I discussed the limitations of evaluation and management by telemedicine and the availability of in person appointments. Patient expressed understanding and agreed to proceed.   Visit performed using audio  Patient:home Provider:home   Review of Systems    Defer to provider Cardiac Risk Factors include: advanced age (>71men, >43 women);hypertension;diabetes mellitus;male gender     Objective:    Today's Vitals   06/20/21 1425  PainSc: 2    There is no height or weight on file to calculate BMI.  Advanced Directives 06/20/2021 01/28/2019 09/27/2018  Does Patient Have a Medical Advance Directive? No No Yes  Would patient like information on creating a medical advance directive? No - Patient declined - -    Current Medications (verified) Outpatient Encounter Medications as of 06/20/2021  Medication Sig   atorvastatin (LIPITOR) 40 MG tablet Take 1 tablet (40 mg total) by mouth daily.   glucose blood (ONETOUCH ULTRA) test strip Use to check blood sugar twice daily   HYDROcodone-acetaminophen (NORCO) 10-325 MG tablet Take 1 tablet by mouth in the morning and at bedtime.   insulin glargine, 1 Unit Dial, (TOUJEO SOLOSTAR) 300 UNIT/ML Solostar Pen INJECT 60 UNITS UNDER THE SKIN DAILY   Insulin Pen Needle 31G X 8 MM MISC 1 each by Does not apply route daily.   metFORMIN (GLUCOPHAGE) 1000 MG tablet TAKE 1 TABLET BY MOUTH TWICE DAILY   metFORMIN (GLUCOPHAGE) 500 MG tablet Take 500 mg by mouth 2 (two) times daily with a meal.   pantoprazole (PROTONIX) 40 MG tablet Take 1 tablet (40 mg total) by mouth daily.   sitaGLIPtin (JANUVIA) 100 MG tablet Take 1 tablet (100 mg total) by mouth daily.   No facility-administered encounter medications on file as of 06/20/2021.    Allergies (verified) Morphine and related and Sulfa antibiotics   History: Past Medical History:   Diagnosis Date   Cancer ([place])    prostate   Diabetes mellitus without complication ([place])    Sleep apnea    Past Surgical History:  Procedure Laterality Date   BACK SURGERY     COLONOSCOPY WITH PROPOFOL N/A 09/27/2018   Procedure: COLONOSCOPY WITH PROPOFOL;  Surgeon: Lucilla Lame, MD;  Location: [facility] ENDOSCOPY;  Service: Endoscopy;  Laterality: N/A;   PROSTECTOMY     History reviewed. No pertinent family history. Social History   Socioeconomic History   Marital status: Married    Spouse name: Not on file   Number of children: Not on file   Years of education: Not on file   Highest education level: Not on file  Occupational History   Not on file  Tobacco Use   Smoking status: Never   Smokeless tobacco: Never  Vaping Use   Vaping Use: Never used  Substance and Sexual Activity   Alcohol use: Never   Drug use: Never   Sexual activity: Not on file  Other Topics Concern   Not on file  Social History Narrative   Not on file   Social Determinants of Health   Financial Resource Strain: Low Risk    Difficulty of Paying Living Expenses: Not hard at all  Food Insecurity: No Food Insecurity   Worried About Running Out of Food in the Last Year: Never true   [place] in the Last Year: Never true  Transportation Needs: No Transportation Needs   Lack of Transportation (Medical): No  Lack of Transportation (Non-Medical): No  Physical Activity: Sufficiently Active   Days of Exercise per Week: 5 days   Minutes of Exercise per Session: 30 min  Stress: No Stress Concern Present   Feeling of Stress : Not at all  Social Connections: Moderately Integrated   Frequency of Communication with Friends and Family: More than three times a week   Frequency of Social Gatherings with Friends and Family: More than three times a week   Attends Religious Services: More than 4 times per year   Active Member of [employer] or Organizations: No   Attends Music therapist: Never    Marital Status: Married    Tobacco Counseling Counseling given: Not Answered   Clinical Intake:  Pre-visit preparation completed: Yes  Pain : 0-10 Pain Score: 2  Pain Type: Chronic pain Pain Location: Back Pain Orientation: Lower Pain Onset: More than a month ago Pain Frequency: Several days a week Pain Relieving Factors: patient takes pain medication  Pain Relieving Factors: patient takes pain medication  Nutritional Risks: None Diabetes: Yes CBG done?: No Did pt. bring in CBG monitor from home?: No  How often do you need to have someone help you when you read instructions, pamphlets, or other written materials from your doctor or pharmacy?: 1 - Never What is the last grade level you completed in school?: 12th and then 2 years of college  Diabetic?yes  Interpreter Needed?: No  Information entered by :: Khiry Pasquariello, cma   Activities of Daily Living In your present state of health, do you have any difficulty performing the following activities: 06/20/2021  Hearing? N  Vision? N  Difficulty concentrating or making decisions? N  Walking or climbing stairs? N  Dressing or bathing? N  Doing errands, shopping? N  Preparing Food and eating ? N  Using the Toilet? N  In the past six months, have you accidently leaked urine? N  Do you have problems with loss of bowel control? N  Managing your Medications? N  Managing your Finances? N  Housekeeping or managing your Housekeeping? N  Some recent data might be hidden    Patient Care Team: Cletis Athens, MD as PCP - General (Internal Medicine)  Indicate any recent Medical Services you may have received from other than [facility] providers in the past year (date may be approximate).     Assessment:   This is a routine wellness examination for Michael Ibarra.  Hearing/Vision screen No results found.  Dietary issues and exercise activities discussed: Current Exercise Habits: Home exercise routine, Type of exercise: walking, Time  (Minutes): 30, Frequency (Times/Week): 5, Weekly Exercise (Minutes/Week): 150, Intensity: Mild, Exercise limited by: orthopedic condition(s)   Goals Addressed   None    Depression Screen PHQ 2/9 Scores 06/20/2021 02/18/2021 04/19/2020  PHQ - 2 Score 0 0 0    Fall Risk Fall Risk  06/20/2021 02/18/2021 04/19/2020 03/03/2018  Falls in the past year? 0 0 0 No  Comment - - - Emmi Telephone Survey: data to providers prior to load  Number falls in past yr: 0 0 0 -  Injury with Fall? 0 0 0 -  Risk for fall due to : No Fall Risks No Fall Risks No Fall Risks -  Follow up Falls evaluation completed Falls evaluation completed - -    FALL RISK PREVENTION PERTAINING TO THE HOME:  Any stairs in or around the home? Yes  If so, are there any without handrails? No  Home free of loose throw  rugs in walkways, pet beds, electrical cords, etc? Yes  Adequate lighting in your home to reduce risk of falls? Yes   ASSISTIVE DEVICES UTILIZED TO PREVENT FALLS:  Life alert? No  Use of a cane, walker or w/c? No  Grab bars in the bathroom? No  Shower chair or bench in shower? No  Elevated toilet seat or a handicapped toilet? No   TIMED UP AND GO:  Was the test performed? No .  Length of time to ambulate- NA   Gait steady and fast without use of assistive device  Cognitive Function: MMSE - Mini Mental State Exam 06/20/2021  Not completed: Unable to complete     6CIT Screen 06/20/2021  What Year? 0 points  What month? 0 points  What time? 0 points  Count back from 20 0 points  Months in reverse 0 points  Repeat phrase 0 points  Total Score 0    Immunizations Immunization History  Administered Date(s) Administered   Fluad Quad(high Dose 65+) 04/19/2020, 04/10/2021   Influenza-Unspecified 04/23/2015, 05/03/2018   PFIZER(Purple Top)SARS-COV-2 Vaccination 08/09/2019, 08/30/2019, 05/30/2020   Pneumococcal Conjugate-13 06/25/2015   Td 12/13/2018   Tdap 12/13/2018    TDAP status: Up to  date  Flu Vaccine status: Up to date  Pneumococcal vaccine status: Due, Education has been provided regarding the importance of this vaccine. Advised may receive this vaccine at local pharmacy or Health Dept. Aware to provide a copy of the vaccination record if obtained from local pharmacy or Health Dept. Verbalized acceptance and understanding.  Covid-19 vaccine status: Declined, Education has been provided regarding the importance of this vaccine but patient still declined. Advised may receive this vaccine at local pharmacy or Health Dept.or vaccine clinic. Aware to provide a copy of the vaccination record if obtained from local pharmacy or Health Dept. Verbalized acceptance and understanding.  Qualifies for Shingles Vaccine? No   Zostavax completed No   Shingrix Completed?: No.    Education has been provided regarding the importance of this vaccine. Patient has been advised to call insurance company to determine out of pocket expense if they have not yet received this vaccine. Advised may also receive vaccine at local pharmacy or Health Dept. Verbalized acceptance and understanding.  Screening Tests Health Maintenance  Topic Date Due   FOOT EXAM  Never done   OPHTHALMOLOGY EXAM  Never done   Hepatitis C Screening  Never done   Pneumonia Vaccine 10+ Years old (2 - PPSV23 if available, else PCV20) 06/24/2016   COVID-19 Vaccine (4 - Booster for Pfizer series) 07/06/2021 (Originally 07/25/2020)   Zoster Vaccines- Shingrix (1 of 2) 09/20/2021 (Originally 03/29/1960)   HEMOGLOBIN A1C  10/08/2021   URINE MICROALBUMIN  05/20/2022   COLONOSCOPY (Pts 45-107yrs Insurance coverage will need to be confirmed)  09/28/2023   TETANUS/TDAP  12/12/2028   INFLUENZA VACCINE  Completed   HPV VACCINES  Aged Out    Health Maintenance  Health Maintenance Due  Topic Date Due   FOOT EXAM  Never done   OPHTHALMOLOGY EXAM  Never done   Hepatitis C Screening  Never done   Pneumonia Vaccine 5+ Years old (2 -  PPSV23 if available, else PCV20) 06/24/2016    Colorectal cancer screening: Type of screening: Colonoscopy. Completed 09/27/2018. Repeat every 10 years  Lung Cancer Screening: (Low Dose CT Chest recommended if Age 17-80 years, 30 pack-year currently smoking OR have quit w/in 15years.) does qualify.   Lung Cancer Screening Referral: Patient declined at this tme  Additional Screening:  Hepatitis C Screening: does qualify; will obtain with next blood draw  Vision Screening: Recommended annual ophthalmology exams for early detection of glaucoma and other disorders of the eye. Is the patient up to date with their annual eye exam?  No  Who is the provider or what is the name of the office in which the patient attends annual eye exams? Does not have provider  If pt is not established with a provider, would they like to be referred to a provider to establish care? Yes .  Referral paced  Dental Screening: Recommended annual dental exams for proper oral hygiene  Community Resource Referral / Chronic Care Management: CRR required this visit?  No   CCM required this visit?  No      Plan:     I have personally reviewed and noted the following in the patient's chart:   Medical and social history Use of alcohol, tobacco or illicit drugs  Current medications and supplements including opioid prescriptions. Patient is currently taking opioid prescriptions. Information provided to patient regarding non-opioid alternatives. Patient advised to discuss non-opioid treatment plan with their provider. Functional ability and status Nutritional status Physical activity Advanced directives List of other physicians Hospitalizations, surgeries, and ER visits in previous 12 months Vitals Screenings to include cognitive, depression, and falls Referrals and appointments  In addition, I have reviewed and discussed with patient certain preventive protocols, quality metrics, and best practice  recommendations. A written personalized care plan for preventive services as well as general preventive health recommendations were provided to patient.     Lacretia Nicks, [place]   06/20/2021   Nurse Notes:  Current Outpatient Medications on File Prior to Visit  Medication Sig Dispense Refill   atorvastatin (LIPITOR) 40 MG tablet Take 1 tablet (40 mg total) by mouth daily. 90 tablet 3   glucose blood (ONETOUCH ULTRA) test strip Use to check blood sugar twice daily 100 strip 6   HYDROcodone-acetaminophen (NORCO) 10-325 MG tablet Take 1 tablet by mouth in the morning and at bedtime. 60 tablet 0   insulin glargine, 1 Unit Dial, (TOUJEO SOLOSTAR) 300 UNIT/ML Solostar Pen INJECT 60 UNITS UNDER THE SKIN DAILY 135 mL 2   Insulin Pen Needle 31G X 8 MM MISC 1 each by Does not apply route daily. 100 each 2   metFORMIN (GLUCOPHAGE) 1000 MG tablet TAKE 1 TABLET BY MOUTH TWICE DAILY 60 tablet 6   metFORMIN (GLUCOPHAGE) 500 MG tablet Take 500 mg by mouth 2 (two) times daily with a meal.     pantoprazole (PROTONIX) 40 MG tablet Take 1 tablet (40 mg total) by mouth daily. 90 tablet 3   sitaGLIPtin (JANUVIA) 100 MG tablet Take 1 tablet (100 mg total) by mouth daily. 90 tablet 3   No current facility-administered medications on file prior to visit.     Time spent 30 min

## 2021-06-21 NOTE — Progress Notes (Signed)
I have reviewed this visit and agree with the documentation.   

## 2021-07-07 ENCOUNTER — Ambulatory Visit (INDEPENDENT_AMBULATORY_CARE_PROVIDER_SITE_OTHER): Payer: Medicare Other | Admitting: Internal Medicine

## 2021-07-07 VITALS — BP 134/74 | HR 79 | Resp 18 | Ht 72.0 in | Wt 221.3 lb

## 2021-07-07 DIAGNOSIS — G4733 Obstructive sleep apnea (adult) (pediatric): Secondary | ICD-10-CM

## 2021-07-07 DIAGNOSIS — E119 Type 2 diabetes mellitus without complications: Secondary | ICD-10-CM

## 2021-07-07 NOTE — Patient Instructions (Signed)

## 2021-07-07 NOTE — Progress Notes (Signed)
Sleep Medicine   Office Visit  Patient Name: Michael Ibarra DOB: 04-02-1941 MRN [id number]    Chief Complaint: evaluation, history of OSA.   Brief History: 15 year  [name] presents with a noe stating 15 year history of OSA.  However, patient states no usage in 10 - 15 years so he wants to start over again with PAP treatment.. Patient states he feels better when he goes to bed than when he wakes,    Sleep quality is very poor. This is noted almost every night. The patient reports choking, gasping for air and apneas at night Patient reports excessive daytime sleepiness with morning and afternoon headaches. The patient goes to sleep at 10 pm and wakes up at 1 am and stays awake 1 - 1.5 hours, then repeats the cycle all night..   Sleep quality is the dame when outside home environment.  Patient has noted cramping of his legs at night.  The patient  relates no unusual behavior during the night.  The patient denies a history of psychiatric problems. The Epworth Sleepiness Score is 20 out of 24 .  The patient relates  Cardiovascular risk factors include: none   ROS  General: (-) fever, (-) chills, (-) night sweat Nose and Sinuses: (-) nasal stuffiness or itchiness, (-) postnasal drip, (-) nosebleeds, (-) sinus trouble. Mouth and Throat: (-) sore throat, (-) hoarseness. Neck: (-) swollen glands, (-) enlarged thyroid, (-) neck pain. Respiratory: - cough, - shortness of breath, - wheezing. Neurologic: - numbness, - tingling. Psychiatric: - anxiety, - depression Sleep behavior: -sleep paralysis -hypnogogic hallucinations -dream enactment      -vivid dreams -cataplexy -night terrors -sleep walking   Current Medication: Outpatient Encounter Medications as of 07/07/2021  Medication Sig   atorvastatin (LIPITOR) 40 MG tablet Take 1 tablet (40 mg total) by mouth daily.   glucose blood (ONETOUCH ULTRA) test strip Use to check blood sugar twice daily   HYDROcodone-acetaminophen (NORCO) 10-325 MG tablet  Take 1 tablet by mouth in the morning and at bedtime.   insulin glargine, 1 Unit Dial, (TOUJEO SOLOSTAR) 300 UNIT/ML Solostar Pen INJECT 60 UNITS UNDER THE SKIN DAILY   Insulin Pen Needle 31G X 8 MM MISC 1 each by Does not apply route daily.   metFORMIN (GLUCOPHAGE) 1000 MG tablet TAKE 1 TABLET BY MOUTH TWICE DAILY   metFORMIN (GLUCOPHAGE) 500 MG tablet Take 500 mg by mouth 2 (two) times daily with a meal.   pantoprazole (PROTONIX) 40 MG tablet Take 1 tablet (40 mg total) by mouth daily.   sitaGLIPtin (JANUVIA) 100 MG tablet Take 1 tablet (100 mg total) by mouth daily.   No facility-administered encounter medications on file as of 07/07/2021.    Surgical History: Past Surgical History:  Procedure Laterality Date   BACK SURGERY     COLONOSCOPY WITH PROPOFOL N/A 09/27/2018   Procedure: COLONOSCOPY WITH PROPOFOL;  Surgeon: Lucilla Lame, MD;  Location: [facility] ENDOSCOPY;  Service: Endoscopy;  Laterality: N/A;   PROSTECTOMY      Medical History: Past Medical History:  Diagnosis Date   Cancer ([place])    prostate   Diabetes mellitus without complication ([place])    Sleep apnea     Family History: Non contributory to the present illness  Social History: Social History   Socioeconomic History   Marital status: Married    Spouse name: Not on file   Number of children: Not on file   Years of education: Not on file   Highest education level:  Not on file  Occupational History   Not on file  Tobacco Use   Smoking status: Never   Smokeless tobacco: Never  Vaping Use   Vaping Use: Never used  Substance and Sexual Activity   Alcohol use: Never   Drug use: Never   Sexual activity: Not on file  Other Topics Concern   Not on file  Social History Narrative   Not on file   Social Determinants of Health   Financial Resource Strain: Low Risk    Difficulty of Paying Living Expenses: Not hard at all  Food Insecurity: No Food Insecurity   Worried About Charity fundraiser in the Last Year:  Never true   [place] in the Last Year: Never true  Transportation Needs: No Transportation Needs   Lack of Transportation (Medical): No   Lack of Transportation (Non-Medical): No  Physical Activity: Sufficiently Active   Days of Exercise per Week: 5 days   Minutes of Exercise per Session: 30 min  Stress: No Stress Concern Present   Feeling of Stress : Not at all  Social Connections: Moderately Integrated   Frequency of Communication with Friends and Family: More than three times a week   Frequency of Social Gatherings with Friends and Family: More than three times a week   Attends Religious Services: More than 4 times per year   Active Member of [employer] or Organizations: No   Attends Archivist Meetings: Never   Marital Status: Married  Human resources officer Violence: Not At Risk   Fear of Current or Ex-Partner: No   Emotionally Abused: No   Physically Abused: No   Sexually Abused: No    Vital Signs: There were no vitals taken for this visit. There is no height or weight on file to calculate BMI.   Examination: General Appearance: The patient is well-developed, well-nourished, and in no distress. Neck Circumference: 42cm : Gross inspection of skin unremarkable. Head: normocephalic, no gross deformities. Eyes: no gross deformities noted. ENT: ears appear grossly normal Neurologic: Alert and oriented. No involuntary movements.    EPWORTH SLEEPINESS SCALE:  Scale:  (0)= no chance of dozing; (1)= slight chance of dozing; (2)= moderate chance of dozing; (3)= high chance of dozing  Chance  Situtation    Sitting and reading: 3    Watching TV: 3    Sitting Inactive in public: 2    As a passenger in car: 3      Lying down to rest: 3    Sitting and talking: 2    Sitting quielty after lunch: 3    In a car, stopped in traffic: 1   TOTAL SCORE:   20 out of 24    SLEEP STUDIES:  PSG 05/04/21 -  overall AHI 43.7,  Supine AHI 53.7, SpO2 min 80% 05/21/21  - PLM 18.1, CPAP @ 11cmH2O   LABS: Recent Results (from the past 2160 hour(s))  Hemoglobin A1c     Status: None   Collection Time: 04/10/21 12:00 AM  Result Value Ref Range   Hemoglobin A1C 7.5   POCT glucose (manual entry)     Status: Abnormal   Collection Time: 04/22/21  4:00 PM  Result Value Ref Range   POC Glucose 191 (A) 70 - 99 mg/dl  Microalbumin, urine     Status: None   Collection Time: 05/20/21 12:00 AM  Result Value Ref Range   Microalb, Ur normal     Radiology: DG HIP UNILAT WITH PELVIS  2-3 VIEWS LEFT  Result Date: 10/01/2020 CLINICAL DATA:  Hip pain for several months, no known injury, initial encounter EXAM: DG HIP (WITH OR WITHOUT PELVIS) 3V LEFT COMPARISON:  None. FINDINGS: Pelvic ring is intact. No acute fracture or dislocation is noted. No soft tissue abnormality is seen. Penile prosthesis is noted. No lytic or sclerotic lesions are seen. IMPRESSION: No acute abnormality noted. Electronically Signed   By: Inez Catalina M.D.   On: 10/01/2020 10:53   DG Hip Unilat W OR W/O Pelvis 2-3 Views Right  Result Date: 10/01/2020 CLINICAL DATA:  Right hip pain for several months, no known injury, initial encounter EXAM: DG HIP (WITH OR WITHOUT PELVIS) 3V RIGHT COMPARISON:  None. FINDINGS: Pelvic ring is intact. Penile prosthesis is seen. No acute fracture or dislocation is noted. No lytic or sclerotic lesions are seen. No soft tissue abnormality is noted. IMPRESSION: No acute abnormality noted. Electronically Signed   By: Inez Catalina M.D.   On: 10/01/2020 10:52    No results found.  No results found.    Assessment and Plan: Patient Active Problem List   Diagnosis Date Noted   Bronchitis due to parainfluenza virus 07/22/2020   Suspected COVID-19 virus infection 07/22/2020   Need for influenza vaccination 04/21/2020   Diabetes 1.5, managed as type 2 ([place]) 04/21/2020   Diabetes mellitus without complication ([place])    Gallstones 12/15/2019   Snoring 12/08/2019   Family  history of abdominal aortic aneurysm 12/08/2019   Osteoarthritis of cervical spine 12/08/2019   Dyslipidemia 12/08/2019   Prediabetes 12/08/2019   Diarrhea 12/08/2019   Personal history of colonic polyps    Benign neoplasm of ascending colon    1. OSA (obstructive sleep apnea) PLAN OSA:   Patient evaluation suggests high risk of sleep disordered breathing due to OSA on PSG with ahi of 43.7.  Suggest: start CPAP at 11 cm H20  to treat the patient's sleep disordered breathing. The patient was also counselled on weight loss to optimize sleep health.  OSA- start cpap, f/u 30d after set up.    2. Diabetes mellitus without complication ([place]) Continue with medications and follow up as scheduled.    General Counseling: I have discussed the findings of the evaluation and examination with Dominica Severin.  I have also discussed any further diagnostic evaluation thatmay be needed or ordered today. Yoon verbalizes understanding of the findings of todays visit. We also reviewed his medications today and discussed drug interactions and side effects including but not limited excessive drowsiness and altered mental states. We also discussed that there is always a risk not just to him but also people around him. he has been encouraged to call the office with any questions or concerns that should arise related to todays visit.  No orders of the defined types were placed in this encounter.       I have personally obtained a history, evaluated the patient, evaluated pertinent data, formulated the assessment and plan and placed orders.   This patient was seen today by Tressie Ellis, PA-C in collaboration with Dr. Devona Konig.   Allyne Gee, MD [facility] Diplomate ABMS Pulmonary and Critical Care Medicine Sleep medicine

## 2021-07-23 ENCOUNTER — Other Ambulatory Visit: Payer: Self-pay | Admitting: Physical Medicine & Rehabilitation

## 2021-07-23 DIAGNOSIS — M5416 Radiculopathy, lumbar region: Secondary | ICD-10-CM

## 2021-08-08 ENCOUNTER — Ambulatory Visit: Payer: Medicare Other

## 2021-09-10 ENCOUNTER — Other Ambulatory Visit: Payer: Self-pay

## 2021-09-10 ENCOUNTER — Ambulatory Visit
Admission: RE | Admit: 2021-09-10 | Discharge: 2021-09-10 | Disposition: A | Discharge status: Home / Self Care | Payer: Medicare Other | Source: Ambulatory Visit | Attending: Physical Medicine & Rehabilitation | Admitting: Physical Medicine & Rehabilitation

## 2021-09-10 DIAGNOSIS — M5416 Radiculopathy, lumbar region: Secondary | ICD-10-CM | POA: Diagnosis present

## 2021-09-17 ENCOUNTER — Other Ambulatory Visit: Payer: Self-pay | Admitting: Physical Medicine & Rehabilitation

## 2021-09-17 DIAGNOSIS — M5412 Radiculopathy, cervical region: Secondary | ICD-10-CM

## 2021-09-22 ENCOUNTER — Other Ambulatory Visit: Payer: Self-pay

## 2021-09-22 DIAGNOSIS — Z86010 Personal history of colonic polyps: Secondary | ICD-10-CM

## 2021-09-22 MED ORDER — PEG 3350-KCL-NA BICARB-NACL 420 G PO SOLR: 4000.0000 mL | Freq: Once | ORAL | 0 refills | Status: AC

## 2021-09-22 NOTE — Progress Notes (Signed)
Gastroenterology Pre-Procedure Review  Request Date: 10/13/2021 Requesting Physician: Dr. Allen Norris  PATIENT REVIEW QUESTIONS: The patient responded to the following health history questions as indicated:    1. Are you having any GI issues? no 2. Do you have a personal history of Polyps? yes (last colonoscopy) 3. Do you have a family history of Colon Cancer or Polyps? no 4. Diabetes Mellitus? yes (type 2 ) 5. Joint replacements in the past 12 months?no 6. Major health problems in the past 3 months?no 7. Any artificial heart valves, MVP, or defibrillator?no    MEDICATIONS & ALLERGIES:    Patient reports the following regarding taking any anticoagulation/antiplatelet therapy:   Plavix, Coumadin, Eliquis, Xarelto, Lovenox, Pradaxa, Brilinta, or Effient? no Aspirin? yes (81 mg asprin)  Patient confirms/reports the following medications:  Current Outpatient Medications  Medication Sig Dispense Refill   aspirin 81 MG chewable tablet Chew by mouth.     atorvastatin (LIPITOR) 20 MG tablet Take by mouth.     atorvastatin (LIPITOR) 40 MG tablet Take 1 tablet (40 mg total) by mouth daily. 90 tablet 3   Cholecalciferol 50 MCG (2000 UT) TABS Take by mouth.     glucose blood (ONETOUCH ULTRA) test strip Use to check blood sugar twice daily 100 strip 6   HYDROcodone-acetaminophen (NORCO) 10-325 MG tablet Take by mouth.     insulin glargine, 1 Unit Dial, (TOUJEO SOLOSTAR) 300 UNIT/ML Solostar Pen INJECT 60 UNITS UNDER THE SKIN DAILY 135 mL 2   insulin glargine, 1 Unit Dial, (TOUJEO) 300 UNIT/ML Solostar Pen Inject into the skin.     Insulin Pen Needle 31G X 8 MM MISC 1 each by Does not apply route daily. 100 each 2   metFORMIN (GLUCOPHAGE) 1000 MG tablet TAKE 1 TABLET BY MOUTH TWICE DAILY 60 tablet 6   metFORMIN (GLUCOPHAGE) 1000 MG tablet Take by mouth.     metFORMIN (GLUCOPHAGE) 500 MG tablet Take 500 mg by mouth 2 (two) times daily with a meal.     pantoprazole (PROTONIX) 40 MG tablet Take 1 tablet  (40 mg total) by mouth daily. 90 tablet 3   pantoprazole (PROTONIX) 40 MG tablet Take 1 tablet by mouth daily.     sitaGLIPtin (JANUVIA) 100 MG tablet Take 1 tablet (100 mg total) by mouth daily. 90 tablet 3   sitaGLIPtin (JANUVIA) 100 MG tablet Take 1 tablet by mouth daily.     No current facility-administered medications for this visit.    Patient confirms/reports the following allergies:  Allergies  Allergen Reactions   Morphine Other (See Comments)   Morphine And Related Other (See Comments)    headaches   Oxycodone Other (See Comments)    Other reaction(s): Hallucination    Sulfa Antibiotics Other (See Comments), Hives and Itching    headaches    No orders of the defined types were placed in this encounter.   AUTHORIZATION INFORMATION Primary Insurance: 1D#: Group #:  Secondary Insurance: 1D#: Group #:  SCHEDULE INFORMATION: Date: 10/13/2021 Time: Location:[facility]

## 2021-09-23 ENCOUNTER — Ambulatory Visit
Admission: RE | Admit: 2021-09-23 | Discharge: 2021-09-23 | Disposition: A | Discharge status: Home / Self Care | Payer: Medicare Other | Source: Ambulatory Visit | Attending: Physical Medicine & Rehabilitation | Admitting: Physical Medicine & Rehabilitation

## 2021-09-23 ENCOUNTER — Other Ambulatory Visit: Payer: Self-pay

## 2021-09-23 DIAGNOSIS — M5412 Radiculopathy, cervical region: Secondary | ICD-10-CM

## 2021-09-23 MED ORDER — IOPAMIDOL (ISOVUE-M 300) INJECTION 61%
1.0000 mL | Freq: Once | INTRAMUSCULAR | Status: AC
  Administered 2021-09-23: 1 mL via EPIDURAL

## 2021-09-23 MED ORDER — TRIAMCINOLONE ACETONIDE 40 MG/ML IJ SUSP (RADIOLOGY)
60.0000 mg | Freq: Once | INTRAMUSCULAR | Status: AC
  Administered 2021-09-23: 60 mg via EPIDURAL

## 2021-09-23 NOTE — Discharge Instructions (Signed)

## 2021-10-06 ENCOUNTER — Encounter: Payer: Self-pay | Admitting: Gastroenterology

## 2021-10-07 NOTE — Progress Notes (Signed)
[facility] [place], [place] [address]  Pulmonary Sleep Medicine   Office Visit Note  Patient Name: Michael Ibarra DOB: June 17, 1941 MRN [id number]    Chief Complaint: Obstructive Sleep Apnea visit  Brief History:  [place] is seen today for follow up visit for CPAP@ 11 cmH2O. The patient has a 16 year history of sleep apnea. Patient is using PAP nightly.  The patient feels rested after sleeping with PAP.  The patient reports benefiting from PAP use. Reported sleepiness is improved and the Epworth Sleepiness Score is 10 out of 24. The patient occasionally take naps. The patient complains of the following: None.  The compliance download shows 77% compliance with an average use time of 5 hours 26 minutes. The AHI is 5.3.  The patient does not complain of limb movements disrupting sleep.  ROS  General: (-) fever, (-) chills, (-) night sweat Nose and Sinuses: (-) nasal stuffiness or itchiness, (-) postnasal drip, (-) nosebleeds, (-) sinus trouble. Mouth and Throat: (-) sore throat, (-) hoarseness. Neck: (-) swollen glands, (-) enlarged thyroid, (-) neck pain. Respiratory: - cough, - shortness of breath, - wheezing. Neurologic: - numbness, - tingling. Psychiatric: - anxiety, - depression   Current Medication: Outpatient Encounter Medications as of 10/08/2021  Medication Sig   aspirin 81 MG chewable tablet Chew by mouth.   atorvastatin (LIPITOR) 40 MG tablet Take 1 tablet (40 mg total) by mouth daily.   Cholecalciferol 50 MCG (2000 UT) TABS Take by mouth.   glucose blood (ONETOUCH ULTRA) test strip Use to check blood sugar twice daily   HYDROcodone-acetaminophen (NORCO) 10-325 MG tablet Take by mouth. (Patient not taking: Reported on 10/06/2021)   insulin glargine, 1 Unit Dial, (TOUJEO SOLOSTAR) 300 UNIT/ML Solostar Pen INJECT 60 UNITS UNDER THE SKIN DAILY   Insulin Pen Needle 31G X 8 MM MISC 1 each by Does not apply route daily.   metFORMIN (GLUCOPHAGE) 1000 MG  tablet TAKE 1 TABLET BY MOUTH TWICE DAILY (Patient taking differently: 1,000 mg. Taking once daily)   pantoprazole (PROTONIX) 40 MG tablet Take 1 tablet (40 mg total) by mouth daily.   pantoprazole (PROTONIX) 40 MG tablet Take 1 tablet by mouth daily.   sitaGLIPtin (JANUVIA) 100 MG tablet Take 1 tablet (100 mg total) by mouth daily.   No facility-administered encounter medications on file as of 10/08/2021.    Surgical History: Past Surgical History:  Procedure Laterality Date   BACK SURGERY     Cervical fusion   COLONOSCOPY WITH PROPOFOL N/A 09/27/2018   Procedure: COLONOSCOPY WITH PROPOFOL;  Surgeon: Lucilla Lame, MD;  Location: [facility] ENDOSCOPY;  Service: Endoscopy;  Laterality: N/A;   PROSTECTOMY      Medical History: Past Medical History:  Diagnosis Date   Arthritis    Cancer ([place])    prostate   Complication of anesthesia    Delayed confusion after prostate surgery   Diabetes mellitus without complication (HCC)    GERD (gastroesophageal reflux disease)    Sleep apnea    CPAP    Family History: Non contributory to the present illness  Social History: Social History   Socioeconomic History   Marital status: Married    Spouse name: Not on file   Number of children: Not on file   Years of education: Not on file   Highest education level: Not on file  Occupational History   Not on file  Tobacco Use   Smoking status: Never   Smokeless tobacco: Never  Vaping  Use   Vaping Use: Never used  Substance and Sexual Activity   Alcohol use: Never   Drug use: Never   Sexual activity: Not on file  Other Topics Concern   Not on file  Social History Narrative   Not on file   Social Determinants of Health   Financial Resource Strain: Low Risk    Difficulty of Paying Living Expenses: Not hard at all  Food Insecurity: No Food Insecurity   Worried About Charity fundraiser in the Last Year: Never true   [place] in the Last Year: Never true  Transportation Needs: No  Transportation Needs   Lack of Transportation (Medical): No   Lack of Transportation (Non-Medical): No  Physical Activity: Sufficiently Active   Days of Exercise per Week: 5 days   Minutes of Exercise per Session: 30 min  Stress: No Stress Concern Present   Feeling of Stress : Not at all  Social Connections: Moderately Integrated   Frequency of Communication with Friends and Family: More than three times a week   Frequency of Social Gatherings with Friends and Family: More than three times a week   Attends Religious Services: More than 4 times per year   Active Member of [employer] or Organizations: No   Attends Archivist Meetings: Never   Marital Status: Married  Human resources officer Violence: Not At Risk   Fear of Current or Ex-Partner: No   Emotionally Abused: No   Physically Abused: No   Sexually Abused: No    Vital Signs: Blood pressure (!) 146/76, pulse 73, resp. rate 18, height '5\' 11"'$  (1.803 m), weight 217 lb (98.4 kg), SpO2 95 %. Body mass index is 30.27 kg/m.    Examination: General Appearance: The patient is well-developed, well-nourished, and in no distress. Neck Circumference: 42 cm Skin: Gross inspection of skin unremarkable. Head: normocephalic, no gross deformities. Eyes: no gross deformities noted. ENT: ears appear grossly normal Neurologic: Alert and oriented. No involuntary movements.    EPWORTH SLEEPINESS SCALE:  Scale:  (0)= no chance of dozing; (1)= slight chance of dozing; (2)= moderate chance of dozing; (3)= high chance of dozing  Chance  Situtation    Sitting and reading: 2    Watching TV: 2    Sitting Inactive in public: 1    As a passenger in car: 1      Lying down to rest: 2    Sitting and talking: 1    Sitting quielty after lunch: 1    In a car, stopped in traffic: 0   TOTAL SCORE:   10 out of 24    SLEEP STUDIES:  PSG (05/2021) AHI 44/hr, Supine AHI 54/hr, min SpO2 80% Titration (05/2021) CPAP@ 11 cmH2O, PLM  18.1   CPAP COMPLIANCE DATA:  Date Range: 09/06/2021-10/05/2021  Average Daily Use: 5 hours 26 minutes  Median Use: 5 hours 34 minutes  Compliance for > 4 Hours: 77%  AHI: 5.3 respiratory events per hour  Days Used: 30/30 days  Mask Leak: 60  95th Percentile Pressure: 11         LABS: No results found for this or any previous visit (from the past 2160 hour(s)).  Radiology: Fransico Michael DIAG/THERA/INC NEEDLE/CATH/PLC EPI/CERV/THOR W/IMG  Result Date: 09/23/2021 CLINICAL DATA:  Cervical spondylosis without myelopathy. Neck pain radiating into the head, left slightly greater than right. FLUOROSCOPY: Radiation Exposure Index (as provided by the fluoroscopic device): 3.10 mGy Kerma PROCEDURE: The procedure, risks, benefits, and alternatives were  explained to the patient. Questions regarding the procedure were encouraged and answered. The patient understands and consents to the procedure. CERVICAL EPIDURAL INJECTION An interlaminar approach was performed on the left at C7-T1. A 3.5 inch 20 gauge epidural needle was advanced using loss-of-resistance technique. DIAGNOSTIC EPIDURAL INJECTION Injection of Isovue-M 300 shows a good epidural pattern with spread above and below the level of needle placement, primarily on the left. No vascular opacification is seen. THERAPEUTIC EPIDURAL INJECTION 1.5 ml of Kenalog 40 mixed with 2 ml of normal saline were then instilled. The procedure was well-tolerated, and the patient was discharged thirty minutes following the injection in good condition. IMPRESSION: Technically successful interlaminar epidural injection on the left at C7-T1. Electronically Signed   By: Logan Bores M.D.   On: 09/23/2021 12:30    No results found.  MR CERVICAL SPINE WO CONTRAST  Result Date: 09/10/2021 CLINICAL DATA:  Neck and head pain, history of surgery EXAM: MRI CERVICAL SPINE WITHOUT CONTRAST TECHNIQUE: Multiplanar, multisequence MR imaging of the cervical spine was  performed. No intravenous contrast was administered. COMPARISON:  Most recent available studies from 2010 FINDINGS: Alignment: No significant listhesis. Vertebrae: Postoperative changes of remote anterior fusion at C6-C7 with well incorporated interbody graft. Hardware is not well evaluated on this study. There is no substantial marrow edema. No suspicious osseous lesion. Cord: No abnormal signal. Posterior Fossa, vertebral arteries, paraspinal tissues: Unremarkable. Disc levels: C2-C3:  Fusion of right facet.  No canal or foraminal stenosis. C3-C4:  Fusion of the left facets.  No canal or foraminal stenosis. C4-C5: Disc bulge with endplate osteophytes. Right facet and uncovertebral hypertrophy. No canal or left foraminal stenosis. Marked right foraminal stenosis. C5-C6: Disc bulge with endplate osteophytes. Uncovertebral and facet hypertrophy. No canal stenosis. Minor foraminal stenosis. C6-C7: Fusion level. Bridging endplate osteophytes. No canal or left foraminal stenosis. Mild right foraminal stenosis. C7-T1: Disc bulge with endplate osteophytes. Left facet hypertrophy. No canal or right foraminal stenosis. Marked left foraminal stenosis. IMPRESSION: Degenerative and postoperative changes as detailed above. No high-grade canal stenosis. Foraminal narrowing is greatest on the right at C4-C5 and on the left at C7-T1. Electronically Signed   By: Macy Mis M.D.   On: 09/10/2021 15:01   DG INJECT DIAG/THERA/INC NEEDLE/CATH/PLC EPI/CERV/THOR W/IMG  Result Date: 09/23/2021 CLINICAL DATA:  Cervical spondylosis without myelopathy. Neck pain radiating into the head, left slightly greater than right. FLUOROSCOPY: Radiation Exposure Index (as provided by the fluoroscopic device): 3.10 mGy Kerma PROCEDURE: The procedure, risks, benefits, and alternatives were explained to the patient. Questions regarding the procedure were encouraged and answered. The patient understands and consents to the procedure. CERVICAL  EPIDURAL INJECTION An interlaminar approach was performed on the left at C7-T1. A 3.5 inch 20 gauge epidural needle was advanced using loss-of-resistance technique. DIAGNOSTIC EPIDURAL INJECTION Injection of Isovue-M 300 shows a good epidural pattern with spread above and below the level of needle placement, primarily on the left. No vascular opacification is seen. THERAPEUTIC EPIDURAL INJECTION 1.5 ml of Kenalog 40 mixed with 2 ml of normal saline were then instilled. The procedure was well-tolerated, and the patient was discharged thirty minutes following the injection in good condition. IMPRESSION: Technically successful interlaminar epidural injection on the left at C7-T1. Electronically Signed   By: Logan Bores M.D.   On: 09/23/2021 12:30      Assessment and Plan: Patient Active Problem List   Diagnosis Date Noted   Hyperlipidemia 05/15/2021   Peripheral vascular disease ([place]) 05/15/2021   Diabetes  mellitus due to underlying condition with hyperosmolarity without coma, with long-term current use of insulin ([place]) 05/15/2021   Bronchitis due to parainfluenza virus 07/22/2020   Suspected COVID-19 virus infection 07/22/2020   Need for influenza vaccination 04/21/2020   Diabetes 1.5, managed as type 2 ([place]) 04/21/2020   Diabetes mellitus without complication ([place])    Gallstones 12/15/2019   Snoring 12/08/2019   Family history of abdominal aortic aneurysm 12/08/2019   Osteoarthritis of cervical spine 12/08/2019   Dyslipidemia 12/08/2019   Prediabetes 12/08/2019   Diarrhea 12/08/2019   Personal history of colonic polyps    Benign neoplasm of ascending colon    Low back pain 05/29/2013      The patient does tolerate PAP and reports benefit from PAP use. The patient was reminded how to adjust mask fit and advised to change supplies regularly. The patient was also counselled on nightly use. The compliance is good. The AHI is 5.3 due to leak. Advised to swap out pillows/supplies and schedule  mask fit appt to try different style if leak or irritation continues after supply change.   1. OSA (obstructive sleep apnea) Continue nightly use  2. CPAP use counseling CPAP couseling-Discussed importance of adequate CPAP use as well as proper care and cleaning techniques of machine and all supplies.  3. Obesity (BMI 30.0-34.9) Obesity Counseling: Had a lengthy discussion regarding patients BMI and weight issues. Patient was instructed on portion control as well as increased activity. Also discussed caloric restrictions with trying to maintain intake less than 2000 Kcal. Discussions were made in accordance with the 5As of weight management. Simple actions such as not eating late and if able to, taking a walk is suggested.    General Counseling: I have discussed the findings of the evaluation and examination with Dominica Severin.  I have also discussed any further diagnostic evaluation thatmay be needed or ordered today. [place] verbalizes understanding of the findings of todays visit. We also reviewed his medications today and discussed drug interactions and side effects including but not limited excessive drowsiness and altered mental states. We also discussed that there is always a risk not just to him but also people around him. he has been encouraged to call the office with any questions or concerns that should arise related to todays visit.  No orders of the defined types were placed in this encounter.       I have personally obtained a history, examined the patient, evaluated laboratory and imaging results, formulated the assessment and plan and placed orders.  This patient was seen by Drema Dallas, PA-C in collaboration with Dr. Devona Konig as a part of collaborative care agreement.  Allyne Gee, MD [facility] Diplomate ABMS Pulmonary Critical Care Medicine and Sleep Medicine

## 2021-10-08 ENCOUNTER — Ambulatory Visit (INDEPENDENT_AMBULATORY_CARE_PROVIDER_SITE_OTHER): Payer: Medicare Other | Admitting: Internal Medicine

## 2021-10-08 VITALS — BP 146/76 | HR 73 | Resp 18 | Ht 71.0 in | Wt 217.0 lb

## 2021-10-08 DIAGNOSIS — G4733 Obstructive sleep apnea (adult) (pediatric): Secondary | ICD-10-CM | POA: Diagnosis not present

## 2021-10-08 DIAGNOSIS — E669 Obesity, unspecified: Secondary | ICD-10-CM

## 2021-10-08 DIAGNOSIS — Z7189 Other specified counseling: Secondary | ICD-10-CM | POA: Diagnosis not present

## 2021-10-08 NOTE — Patient Instructions (Signed)

## 2021-10-13 ENCOUNTER — Ambulatory Visit: Payer: Medicare Other | Admitting: Anesthesiology

## 2021-10-13 ENCOUNTER — Encounter: Payer: Self-pay | Admitting: Gastroenterology

## 2021-10-13 ENCOUNTER — Other Ambulatory Visit: Payer: Self-pay

## 2021-10-13 ENCOUNTER — Ambulatory Visit
Admission: RE | Admit: 2021-10-13 | Discharge: 2021-10-13 | Disposition: A | Discharge status: Home / Self Care | Payer: Medicare Other | Attending: Gastroenterology | Admitting: Gastroenterology

## 2021-10-13 ENCOUNTER — Encounter
Admission: RE | Disposition: A | Discharge status: Home / Self Care | Payer: Self-pay | Source: Home / Self Care | Attending: Gastroenterology

## 2021-10-13 DIAGNOSIS — E119 Type 2 diabetes mellitus without complications: Secondary | ICD-10-CM | POA: Diagnosis not present

## 2021-10-13 DIAGNOSIS — G4730 Sleep apnea, unspecified: Secondary | ICD-10-CM | POA: Diagnosis not present

## 2021-10-13 DIAGNOSIS — D122 Benign neoplasm of ascending colon: Secondary | ICD-10-CM | POA: Insufficient documentation

## 2021-10-13 DIAGNOSIS — Z09 Encounter for follow-up examination after completed treatment for conditions other than malignant neoplasm: Secondary | ICD-10-CM | POA: Diagnosis present

## 2021-10-13 DIAGNOSIS — D123 Benign neoplasm of transverse colon: Secondary | ICD-10-CM | POA: Diagnosis not present

## 2021-10-13 DIAGNOSIS — Z860100 Personal history of colon polyps, unspecified: Secondary | ICD-10-CM

## 2021-10-13 DIAGNOSIS — I10 Essential (primary) hypertension: Secondary | ICD-10-CM | POA: Insufficient documentation

## 2021-10-13 DIAGNOSIS — K635 Polyp of colon: Secondary | ICD-10-CM | POA: Diagnosis not present

## 2021-10-13 DIAGNOSIS — K5730 Diverticulosis of large intestine without perforation or abscess without bleeding: Secondary | ICD-10-CM | POA: Insufficient documentation

## 2021-10-13 DIAGNOSIS — K219 Gastro-esophageal reflux disease without esophagitis: Secondary | ICD-10-CM | POA: Insufficient documentation

## 2021-10-13 DIAGNOSIS — K641 Second degree hemorrhoids: Secondary | ICD-10-CM | POA: Diagnosis not present

## 2021-10-13 HISTORY — DX: Other complications of anesthesia, initial encounter: T88.59XA

## 2021-10-13 HISTORY — PX: POLYPECTOMY: SHX5525

## 2021-10-13 HISTORY — DX: Unspecified osteoarthritis, unspecified site: M19.90

## 2021-10-13 HISTORY — PX: COLONOSCOPY WITH PROPOFOL: SHX5780

## 2021-10-13 HISTORY — DX: Gastro-esophageal reflux disease without esophagitis: K21.9

## 2021-10-13 LAB — GLUCOSE, CAPILLARY
Glucose-Capillary: 172 mg/dL — ABNORMAL HIGH (ref 70–99)
Glucose-Capillary: 136 mg/dL — ABNORMAL HIGH (ref 70–99)

## 2021-10-13 SURGERY — COLONOSCOPY WITH PROPOFOL
Anesthesia: General | Site: Rectum

## 2021-10-13 MED ORDER — LIDOCAINE HCL (CARDIAC) PF 100 MG/5ML IV SOSY
PREFILLED_SYRINGE | INTRAVENOUS | Status: DC | PRN
  Administered 2021-10-13: 60 mg via INTRAVENOUS

## 2021-10-13 MED ORDER — LACTATED RINGERS IV SOLN: INTRAVENOUS | Status: DC

## 2021-10-13 MED ORDER — PROPOFOL 10 MG/ML IV BOLUS
INTRAVENOUS | Status: DC | PRN
  Administered 2021-10-13 (×2): 30 mg via INTRAVENOUS
  Administered 2021-10-13: 50 mg via INTRAVENOUS
  Administered 2021-10-13 (×5): 30 mg via INTRAVENOUS

## 2021-10-13 MED ORDER — STERILE WATER FOR IRRIGATION IR SOLN
Status: DC | PRN
  Administered 2021-10-13: 1

## 2021-10-13 MED ORDER — SODIUM CHLORIDE 0.9 % IV SOLN: INTRAVENOUS | Status: DC

## 2021-10-13 SURGICAL SUPPLY — 22 items
CLIP HMST 235XBRD CATH ROT (MISCELLANEOUS) IMPLANT
CLIP RESOLUTION 360 11X235 (MISCELLANEOUS)
ELECT REM PT RETURN 9FT ADLT (ELECTROSURGICAL)
ELECTRODE REM PT RTRN 9FT ADLT (ELECTROSURGICAL) IMPLANT
FORCEPS BIOP RAD 4 LRG CAP 4 (CUTTING FORCEPS) IMPLANT
GOWN CVR UNV OPN BCK APRN NK (MISCELLANEOUS) ×4 IMPLANT
GOWN ISOL THUMB LOOP REG UNIV (MISCELLANEOUS) ×6
INJECTOR VARIJECT VIN23 (MISCELLANEOUS) IMPLANT
KIT DEFENDO VALVE AND CONN (KITS) IMPLANT
KIT PRC NS LF DISP ENDO (KITS) ×2 IMPLANT
KIT PROCEDURE OLYMPUS (KITS) ×3
MANIFOLD NEPTUNE II (INSTRUMENTS) ×3 IMPLANT
MARKER SPOT ENDO TATTOO 5ML (MISCELLANEOUS) IMPLANT
PROBE APC STR FIRE (PROBE) IMPLANT
RETRIEVER NET ROTH 2.5X230 LF (MISCELLANEOUS) IMPLANT
SNARE COLD EXACTO (MISCELLANEOUS) ×1 IMPLANT
SNARE SHORT THROW 13M SML OVAL (MISCELLANEOUS) IMPLANT
SNARE SNG USE RND 15MM (INSTRUMENTS) IMPLANT
SPOT EX ENDOSCOPIC TATTOO (MISCELLANEOUS)
TRAP ETRAP POLY (MISCELLANEOUS) ×1 IMPLANT
VARIJECT INJECTOR VIN23 (MISCELLANEOUS)
WATER STERILE IRR 250ML POUR (IV SOLUTION) ×3 IMPLANT

## 2021-10-13 NOTE — H&P (Signed)
? ?Lucilla Lame, MD [facility] ?[address] ?[place], [place] [address] ?Phone:[phone] ?Fax : [phone] ? ?Primary Care Physician:  Cletis Athens, MD ?Primary Gastroenterologist:  Dr. Allen Norris ? ?Pre-Procedure History & Physical: ?HPI:  Michael Ibarra is a 81 y.o. male is here for an colonoscopy. ?  ?Past Medical History:  ?Diagnosis Date  ? Arthritis   ? Cancer [facility])   ? prostate  ? Complication of anesthesia   ? Delayed confusion after prostate surgery  ? Diabetes mellitus without complication ([place])   ? GERD (gastroesophageal reflux disease)   ? Sleep apnea   ? CPAP  ? ? ?Past Surgical History:  ?Procedure Laterality Date  ? BACK SURGERY    ? Cervical fusion  ? COLONOSCOPY WITH PROPOFOL N/A 09/27/2018  ? Procedure: COLONOSCOPY WITH PROPOFOL;  Surgeon: Lucilla Lame, MD;  Location: [facility] ENDOSCOPY;  Service: Endoscopy;  Laterality: N/A;  ? PROSTECTOMY    ? ? ?Prior to Admission medications   ?Medication Sig Start Date End Date Taking? Authorizing Provider  ?aspirin 81 MG chewable tablet Chew by mouth.   Yes [provider]  ?atorvastatin (LIPITOR) 40 MG tablet Take 1 tablet (40 mg total) by mouth daily. 05/05/21  Yes Cletis Athens, MD  ?Cholecalciferol 50 MCG (2000 UT) TABS Take by mouth.   Yes [provider]  ?glucose blood (ONETOUCH ULTRA) test strip Use to check blood sugar twice daily 01/24/20  Yes Masoud, Viann Shove, MD  ?insulin glargine, 1 Unit Dial, (TOUJEO SOLOSTAR) 300 UNIT/ML Solostar Pen INJECT 60 UNITS UNDER THE SKIN DAILY 04/30/21  Yes Masoud, Viann Shove, MD  ?Insulin Pen Needle 31G X 8 MM MISC 1 each by Does not apply route daily. 09/18/20  Yes Masoud, Viann Shove, MD  ?metFORMIN (GLUCOPHAGE) 1000 MG tablet TAKE 1 TABLET BY MOUTH TWICE DAILY ?Patient taking differently: 1,000 mg. Taking once daily 06/15/21  Yes Masoud, Viann Shove, MD  ?pantoprazole (PROTONIX) 40 MG tablet Take 1 tablet (40 mg total) by mouth daily. 04/30/21  Yes Masoud, Viann Shove, MD  ?pantoprazole (PROTONIX) 40 MG tablet Take 1 tablet  by mouth daily. 04/04/20  Yes [provider]  ?sitaGLIPtin (JANUVIA) 100 MG tablet Take 1 tablet (100 mg total) by mouth daily. 04/30/21  Yes Masoud, Viann Shove, MD  ?HYDROcodone-acetaminophen (NORCO) 10-325 MG tablet Take by mouth. ?Patient not taking: Reported on 10/06/2021    [provider]  ? ? ?Allergies as of 09/22/2021 - Review Complete 07/07/2021  ?Allergen Reaction Noted  ? Morphine Other (See Comments) 08/24/2011  ? Morphine and related Other (See Comments) 08/24/2011  ? Oxycodone Other (See Comments) 09/02/2015  ? Sulfa antibiotics Other (See Comments), Hives, and Itching 08/24/2011  ? ? ?History reviewed. No pertinent family history. ? ?Social History  ? ?Socioeconomic History  ? Marital status: Married  ?  Spouse name: Not on file  ? Number of children: Not on file  ? Years of education: Not on file  ? Highest education level: Not on file  ?Occupational History  ? Not on file  ?Tobacco Use  ? Smoking status: Never  ? Smokeless tobacco: Never  ?Vaping Use  ? Vaping Use: Never used  ?Substance and Sexual Activity  ? Alcohol use: Never  ? Drug use: Never  ? Sexual activity: Not on file  ?Other Topics Concern  ? Not on file  ?Social History Narrative  ? Not on file  ? ?Social Determinants of Health  ? ?Financial Resource Strain: Low Risk   ? Difficulty of Paying Living Expenses: Not  hard at all  ?Food Insecurity: No Food Insecurity  ? Worried About Charity fundraiser in the Last Year: Never true  ? Ran Out of Food in the Last Year: Never true  ?Transportation Needs: No Transportation Needs  ? Lack of Transportation (Medical): No  ? Lack of Transportation (Non-Medical): No  ?Physical Activity: Sufficiently Active  ? Days of Exercise per Week: 5 days  ? Minutes of Exercise per Session: 30 min  ?Stress: No Stress Concern Present  ? Feeling of Stress : Not at all  ?Social Connections: Moderately Integrated  ? Frequency of Communication with Friends and Family: More than three times a week  ?  Frequency of Social Gatherings with Friends and Family: More than three times a week  ? Attends Religious Services: More than 4 times per year  ? Active Member of Clubs or Organizations: No  ? Attends Archivist Meetings: Never  ? Marital Status: Married  ?Intimate Partner Violence: Not At Risk  ? Fear of Current or Ex-Partner: No  ? Emotionally Abused: No  ? Physically Abused: No  ? Sexually Abused: No  ? ? ?Review of Systems: ?See HPI, otherwise negative ROS ? ?Physical Exam: ?BP 122/76   Pulse 85   Temp 97.6 ?F (36.4 ?C) (Temporal)   Ht '5\' 11"'$  (1.803 m)   Wt 96 kg   SpO2 95%   BMI 29.51 kg/m?  ?General:   Alert,  pleasant and cooperative in NAD ?Head:  Normocephalic and atraumatic. ?Neck:  Supple; no masses or thyromegaly. ?Lungs:  Clear throughout to auscultation.    ?Heart:  Regular rate and rhythm. ?Abdomen:  Soft, nontender and nondistended. Normal bowel sounds, without guarding, and without rebound.   ?Neurologic:  Alert and  oriented x4;  grossly normal neurologically. ? ?Impression/Plan: ?Michael Ibarra is here for an colonoscopy to be performed for a history of adenomatous polyps on 2020 ? ? ?Risks, benefits, limitations, and alternatives regarding  colonoscopy have been reviewed with the patient.  Questions have been answered.  All parties agreeable. ? ? ?Lucilla Lame, MD  10/13/2021, 7:30 AM ?

## 2021-10-13 NOTE — Transfer of Care (Signed)
Immediate Anesthesia Transfer of Care Note ? ?Patient: Michael Ibarra ? ?Procedure(s) Performed: COLONOSCOPY WITH PROPOFOL (Rectum) ? ?Patient Location: PACU ? ?Anesthesia Type: General ? ?Level of Consciousness: awake, alert  and patient cooperative ? ?Airway and Oxygen Therapy: Patient Spontanous Breathing and Patient connected to supplemental oxygen ? ?Post-op Assessment: Post-op Vital signs reviewed, Patient's Cardiovascular Status Stable, Respiratory Function Stable, Patent Airway and No signs of Nausea or vomiting ? ?Post-op Vital Signs: Reviewed and stable ? ?Complications: No notable events documented. ? ?

## 2021-10-13 NOTE — Anesthesia Postprocedure Evaluation (Signed)
Anesthesia Post Note ? ?Patient: Michael Ibarra ? ?Procedure(s) Performed: COLONOSCOPY WITH PROPOFOL (Rect[place]) ?POLYPECTOMY (Rectum) ? ? ?  ?Patient location during evaluation: PACU ?Anesthesia Type: General ?Level of consciousness: awake and alert ?Pain management: pain level controlled ?Vital Signs Assessment: post-procedure vital signs reviewed and stable ?Respiratory status: spontaneous breathing, nonlabored ventilation, respiratory function stable and patient connected to nasal cannula oxygen ?Cardiovascular status: blood pressure returned to baseline and stable ?Postop Assessment: no apparent nausea or vomiting ?Anesthetic complications: no ? ? ?No notable events documented. ? ?Adele Barthel Jaice Lague ? ? ? ? ? ?

## 2021-10-13 NOTE — Anesthesia Procedure Notes (Signed)
Date/Time: 10/13/2021 7:45 AM ?Performed by: Dionne Bucy, CRNA ?Pre-anesthesia Checklist: Patient identified, Emergency Drugs available, Suction available, Patient being monitored and Timeout performed ?Patient Re-evaluated:Patient Re-evaluated prior to induction ?Oxygen Delivery Method: Nasal cannula ?Induction Type: IV induction ?Placement Confirmation: positive ETCO2 ? ? ? ? ?

## 2021-10-13 NOTE — Anesthesia Preprocedure Evaluation (Addendum)
Anesthesia Evaluation  ?Patient identified by MRN, date of birth, ID band ?Patient awake ? ? ? ?History of Anesthesia Complications ?(+) history of anesthetic complications (post-op delerium after prostate surgery) ? ?Airway ?Mallampati: II ? ?TM Distance: >3 FB ?Neck ROM: Full ? ? ? Dental ?no notable dental hx. ? ?  ?Pulmonary ?sleep apnea and Continuous Positive Airway Pressure Ventilation ,  ?  ?Pulmonary exam normal ? ? ? ? ? ? ? Cardiovascular ?Exercise Tolerance: Good ?hypertension, Pt. on medications ?Normal cardiovascular exam ? ? ?  ?Neuro/Psych ?negative neurological ROS ?   ? GI/Hepatic ?Neg liver ROS, GERD  Medicated and Controlled,  ?Endo/Other  ?diabetes, Well Controlled, Type 2 ? Renal/GU ?negative Renal ROS  ? ?  ?Musculoskeletal ? ? Abdominal ?  ?Peds ? Hematology ?negative hematology ROS ?(+)   ?Anesthesia Other Findings ? ? Reproductive/Obstetrics ? ?  ? ? ? ? ? ? ? ? ? ? ? ? ? ?  ?  ? ? ? ? ? ? ? ?Anesthesia Physical ?Anesthesia Plan ? ?ASA: 2 ? ?Anesthesia Plan: General  ? ?Post-op Pain Management: Minimal or no pain anticipated  ? ?Induction: Intravenous ? ?PONV Risk Score and Plan: 2 and Propofol infusion, TIVA and Treatment may vary due to age or medical condition ? ?Airway Management Planned: Nasal Cannula and Simple Face Mask ? ?Additional Equipment: None ? ?Intra-op Plan:  ? ?Post-operative Plan:  ? ?Informed Consent: I have reviewed the patients History and Physical, chart, labs and discussed the procedure including the risks, benefits and alternatives for the proposed anesthesia with the patient or authorized representative who has indicated his/her understanding and acceptance.  ? ? ? ? ? ?Plan Discussed with: CRNA ? ?Anesthesia Plan Comments:   ? ? ? ? ? ? ?Anesthesia Quick Evaluation ? ?

## 2021-10-13 NOTE — Op Note (Signed)
[facility] ?Gastroenterology ?Patient Name: Michael Ibarra ?Procedure Date: 10/13/2021 7:18 AM ?MRN: [id number] ?Account #: [id number] ?Date of Birth: [birth year] ?Admit Type: Outpatient ?Age: 81 ?Room: [facility] OR ROOM 01 ?Gender: Male ?Note Status: Finalized ?Instrument Name: [id number] ?Procedure:             Colonoscopy ?Indications:           High risk colon cancer surveillance: Personal history  ?                       of colonic polyps ?Providers:             Lucilla Lame MD, MD ?Referring MD:          Cletis Athens, MD (Referring MD) ?Medicines:             Propofol per Anesthesia ?Complications:         No immediate complications. ?Procedure:             Pre-Anesthesia Assessment: ?                       - Prior to the procedure, a History and Physical was  ?                       performed, and patient medications and allergies were  ?                       reviewed. The patient's tolerance of previous  ?                       anesthesia was also reviewed. The risks and benefits  ?                       of the procedure and the sedation options and risks  ?                       were discussed with the patient. All questions were  ?                       answered, and informed consent was obtained. Prior  ?                       Anticoagulants: The patient has taken no previous  ?                       anticoagulant or antiplatelet agents. ASA Grade  ?                       Assessment: II - A patient with mild systemic disease.  ?                       After reviewing the risks and benefits, the patient  ?                       was deemed in satisfactory condition to undergo the  ?                       procedure. ?  After obtaining informed consent, the colonoscope was  ?                       passed under direct vision. Throughout the procedure,  ?                       the patient's blood pressure, pulse, and oxygen  ?                       saturations were monitored  continuously. The was  ?                       introduced through the anus and advanced to the the  ?                       cecum, identified by appendiceal orifice and ileocecal  ?                       valve. The colonoscopy was performed without  ?                       difficulty. The patient tolerated the procedure well.  ?                       The quality of the bowel preparation was good. ?Findings: ?     The perianal and digital rectal examinations were normal. ?     Two sessile polyps were found in the ascending colon. The polyps were 3  ?     to 5 mm in size. These polyps were removed with a cold snare. Resection  ?     was complete, but the polyp tissue was only partially retrieved. ?     A 4 mm polyp was found in the transverse colon. The polyp was sessile.  ?     The polyp was removed with a cold snare. Resection and retrieval were  ?     complete. ?     Multiple small-mouthed diverticula were found in the sigmoid colon. ?     Non-bleeding internal hemorrhoids were found during retroflexion. The  ?     hemorrhoids were Grade II (internal hemorrhoids that prolapse but reduce  ?     spontaneously). ?Impression:            - Two 3 to 5 mm polyps in the ascending colon, removed  ?                       with a cold snare. Complete resection. Partial  ?                       retrieval. ?                       - One 4 mm polyp in the transverse colon, removed with  ?                       a cold snare. Resected and retrieved. ?                       - Diverticulosis in the sigmoid colon. ?                       -  Non-bleeding internal hemorrhoids. ?Recommendation:        - Discharge patient to home. ?                       - Resume previous diet. ?                       - Continue present medications. ?                       - Await pathology results. ?                       - Repeat colonoscopy is not recommended for  ?                       surveillance. ?Procedure Code(s):     --- Professional --- ?                        [phone], Colonoscopy, flexible; with removal of  ?                       tumor(s), polyp(s), or other lesion(s) by snare  ?                       technique ?Diagnosis Code(s):     --- Professional --- ?                       Z86.010, Personal history of colonic polyps ?                       K63.5, Polyp of colon ?CPT copyright 2019 American Medical Association. All rights reserved. ?The codes documented in this report are preliminary and upon coder review may  ?be revised to meet current compliance requirements. ?Lucilla Lame MD, MD ?10/13/2021 8:14:03 AM ?This report has been signed electronically. ?Number of Addenda: 0 ?Note Initiated On: 10/13/2021 7:18 AM ?Scope Withdrawal Time: 0 hours 11 minutes 23 seconds  ?Total Procedure Duration: 0 hours 21 minutes 49 seconds  ?Estimated Blood Loss:  Estimated blood loss: none. ?     [facility] ?

## 2021-10-14 ENCOUNTER — Encounter: Payer: Self-pay | Admitting: Gastroenterology

## 2021-10-15 LAB — SURGICAL PATHOLOGY

## 2021-10-16 ENCOUNTER — Encounter: Payer: Self-pay | Admitting: Gastroenterology

## 2021-12-14 ENCOUNTER — Other Ambulatory Visit: Payer: Self-pay | Admitting: Internal Medicine

## 2022-10-05 ENCOUNTER — Ambulatory Visit: Payer: Medicare Other | Admitting: Internal Medicine

## 2022-10-05 NOTE — Progress Notes (Signed)
Pt did not show for scheduled appointment.  

## 2022-10-20 ENCOUNTER — Other Ambulatory Visit
Admission: RE | Admit: 2022-10-20 | Discharge: 2022-10-20 | Disposition: A | Discharge status: Home / Self Care | Payer: Medicare Other | Source: Ambulatory Visit | Attending: Infectious Diseases | Admitting: Infectious Diseases

## 2022-10-20 DIAGNOSIS — R079 Chest pain, unspecified: Secondary | ICD-10-CM | POA: Insufficient documentation

## 2022-10-20 LAB — TROPONIN I (HIGH SENSITIVITY): Troponin I (High Sensitivity): 5 ng/L (ref <18)

## 2022-11-17 ENCOUNTER — Encounter: Payer: Self-pay | Admitting: Internal Medicine

## 2022-11-18 ENCOUNTER — Encounter: Payer: Self-pay | Admitting: Internal Medicine

## 2022-11-18 ENCOUNTER — Ambulatory Visit
Admission: RE | Admit: 2022-11-18 | Discharge: 2022-11-18 | Disposition: A | Discharge status: Home / Self Care | Payer: Medicare Other | Attending: Internal Medicine | Admitting: Internal Medicine

## 2022-11-18 ENCOUNTER — Ambulatory Visit: Payer: Medicare Other | Admitting: Certified Registered"

## 2022-11-18 ENCOUNTER — Encounter
Admission: RE | Disposition: A | Discharge status: Home / Self Care | Payer: Self-pay | Source: Home / Self Care | Attending: Internal Medicine

## 2022-11-18 DIAGNOSIS — G4730 Sleep apnea, unspecified: Secondary | ICD-10-CM | POA: Diagnosis not present

## 2022-11-18 DIAGNOSIS — K5900 Constipation, unspecified: Secondary | ICD-10-CM | POA: Diagnosis not present

## 2022-11-18 DIAGNOSIS — Z7984 Long term (current) use of oral hypoglycemic drugs: Secondary | ICD-10-CM | POA: Diagnosis not present

## 2022-11-18 DIAGNOSIS — Z79891 Long term (current) use of opiate analgesic: Secondary | ICD-10-CM | POA: Diagnosis not present

## 2022-11-18 DIAGNOSIS — G8929 Other chronic pain: Secondary | ICD-10-CM | POA: Diagnosis not present

## 2022-11-18 DIAGNOSIS — E119 Type 2 diabetes mellitus without complications: Secondary | ICD-10-CM | POA: Insufficient documentation

## 2022-11-18 DIAGNOSIS — K222 Esophageal obstruction: Secondary | ICD-10-CM | POA: Insufficient documentation

## 2022-11-18 DIAGNOSIS — R0789 Other chest pain: Secondary | ICD-10-CM | POA: Diagnosis not present

## 2022-11-18 DIAGNOSIS — Z794 Long term (current) use of insulin: Secondary | ICD-10-CM | POA: Diagnosis not present

## 2022-11-18 DIAGNOSIS — Z79899 Other long term (current) drug therapy: Secondary | ICD-10-CM | POA: Insufficient documentation

## 2022-11-18 DIAGNOSIS — K449 Diaphragmatic hernia without obstruction or gangrene: Secondary | ICD-10-CM | POA: Insufficient documentation

## 2022-11-18 DIAGNOSIS — K2100 Gastro-esophageal reflux disease with esophagitis, without bleeding: Secondary | ICD-10-CM | POA: Diagnosis not present

## 2022-11-18 DIAGNOSIS — Z8546 Personal history of malignant neoplasm of prostate: Secondary | ICD-10-CM | POA: Diagnosis not present

## 2022-11-18 DIAGNOSIS — R1314 Dysphagia, pharyngoesophageal phase: Secondary | ICD-10-CM | POA: Diagnosis present

## 2022-11-18 DIAGNOSIS — Z08 Encounter for follow-up examination after completed treatment for malignant neoplasm: Secondary | ICD-10-CM | POA: Insufficient documentation

## 2022-11-18 HISTORY — PX: ESOPHAGOGASTRODUODENOSCOPY (EGD) WITH PROPOFOL: SHX5813

## 2022-11-18 LAB — GLUCOSE, CAPILLARY: Glucose-Capillary: 130 mg/dL — ABNORMAL HIGH (ref 70–99)

## 2022-11-18 SURGERY — ESOPHAGOGASTRODUODENOSCOPY (EGD) WITH PROPOFOL
Anesthesia: General

## 2022-11-18 MED ORDER — SODIUM CHLORIDE 0.9 % IV SOLN: INTRAVENOUS | Status: DC

## 2022-11-18 MED ORDER — PROPOFOL 500 MG/50ML IV EMUL
INTRAVENOUS | Status: DC | PRN
  Administered 2022-11-18 (×2): 50 mg via INTRAVENOUS

## 2022-11-18 MED ORDER — LIDOCAINE HCL (CARDIAC) PF 100 MG/5ML IV SOSY
PREFILLED_SYRINGE | INTRAVENOUS | Status: DC | PRN
  Administered 2022-11-18 (×2): 100 mg via INTRAVENOUS

## 2022-11-18 NOTE — H&P (Signed)
Outpatient short stay form Pre-procedure 11/18/2022 8:25 AM Michael Ibarra K. Norma Fredrickson, M.D.  Primary Physician: Clydie Braun, M.D.  Reason for visit:  Non-cardiac chest pain, GERD, Esophageal dysphagia  History of present illness:  Mr. Michael Ibarra presents to the [facility] at the request of his PCP for urgent consultation for chief complaint of esophageal dysphagia and non-cardiac chest pain. He was seen last week by Dr. Sampson Ibarra for intermittent chest pain which started after episode of esophageal dysphagia after eating chicken and rice while he was in [place], [place]. He had to leave the table to regurgitate the food bolus. He reports he developed a lot of retrosternal pain and discomfort after this dysphagia episode. He had negative chest-xray, negative EKG, negative troponin, and negative CBC and CMP. He was advised to increase Protonix to 40 mg twice daily and had sucralfate added to his regimen. He has not picked up the sucralfate and doesn't recall this being added to his regimen. He has been doing the Protonix 40 mg twice daily. He reports over the past 2-years he has been experiencing intermittent solid food dysphagia to mainly chicken, steak, breads, and rice. The past 34-months episodes have been more frequent. The worst episode was last week when he was in [facility] because he felt like it really got stuck and wouldn't pass. In order to get relief he had to vomit up the food bolus. He has been tolerating his secretions fine without difficulty. He can have mild dysphagia to really cold liquids, but most of the time he doesn't have issues with liquids. He denies any pill dysphagia. The solid food dysphagia is localized to the lower sternum just above the xiphoid process. He has been told before that he has a hiatal hernia. He has longstanding history of GERD and has been on Protonix 40 mg daily for at least 8-10 years. GERD symptoms reasonably well-controlled as long as he avoids greasy foods  and doesn't eat late at night. He denies any odynophagia, early satiety, hoarseness, or epigastric abdominal pain. He has been constipated over the past 14-months attributed to chronic narcotics for chronic back pain. He has been taking 2-3 hydrocodone tablets daily for pain. He has been increasing prunes and has been trying to increase water intake. He has a small BM every 3 days on average now.     Current Facility-Administered Medications:    0.9 %  sodium chloride infusion, , Intravenous, Continuous, Latha Staunton, Michael Nearing, MD  Medications Prior to Admission  Medication Sig Dispense Refill Last Dose   aspirin 81 MG chewable tablet Chew by mouth.   Past Week   atorvastatin (LIPITOR) 40 MG tablet Take 1 tablet (40 mg total) by mouth daily. 90 tablet 3 11/17/2022   Cholecalciferol 50 MCG (2000 UT) TABS Take by mouth.   Past Week   metFORMIN (GLUCOPHAGE) 1000 MG tablet TAKE 1 TABLET BY MOUTH TWICE DAILY (Patient taking differently: 1,000 mg. Taking once daily) 60 tablet 6 11/17/2022   pantoprazole (PROTONIX) 40 MG tablet Take 1 tablet by mouth daily.   11/17/2022   sitaGLIPtin (JANUVIA) 100 MG tablet Take 1 tablet (100 mg total) by mouth daily. 90 tablet 3 11/17/2022   B-D ULTRAFINE III SHORT PEN 31G X 8 MM MISC USE 1 PEN NEEDLE DAILY 100 each 2    glucose blood (ONETOUCH ULTRA) test strip Use to check blood sugar twice daily 100 strip 6    HYDROcodone-acetaminophen (NORCO) 10-325 MG tablet Take by mouth. (Patient not taking: Reported on  10/06/2021)      insulin glargine, 1 Unit Dial, (TOUJEO SOLOSTAR) 300 UNIT/ML Solostar Pen INJECT 60 UNITS UNDER THE SKIN DAILY 135 mL 2    pantoprazole (PROTONIX) 40 MG tablet Take 1 tablet (40 mg total) by mouth daily. 90 tablet 3      Allergies  Allergen Reactions   Morphine And Related Other (See Comments)    Headaches  (pt denies 10/06/21)   Oxycodone Other (See Comments)    Other reaction(s): Hallucination (Pt denies 10/06/21)   Sulfa Antibiotics Other (See  Comments), Hives and Itching    headaches     Past Medical History:  Diagnosis Date   Arthritis    Cancer    prostate   Complication of anesthesia    Delayed confusion after prostate surgery   Diabetes mellitus without complication    GERD (gastroesophageal reflux disease)    Sleep apnea    CPAP    Review of systems:  Otherwise negative.    Physical Exam  Gen: Alert, oriented. Appears stated age.  HEENT: [place]/AT. PERRLA. Lungs: CTA, no wheezes. CV: RR nl S1, S2. Abd: soft, benign, no masses. BS+ Ext: No edema. Pulses 2+    Planned procedures: Proceed with EGD. The patient understands the nature of the planned procedure, indications, risks, alternatives and potential complications including but not limited to bleeding, infection, perforation, damage to internal organs and possible oversedation/side effects from anesthesia. The patient agrees and gives consent to proceed.  Please refer to procedure notes for findings, recommendations and patient disposition/instructions.     Chenae Brager K. Norma Fredrickson, M.D. Gastroenterology 11/18/2022  8:25 AM

## 2022-11-18 NOTE — Op Note (Signed)
[facility] Gastroenterology Patient Name: Michael Ibarra Procedure Date: 11/18/2022 8:26 AM MRN: [id number] Account #: [id number] Date of Birth: 1941-01-17 Admit Type: Outpatient Age: 82 Room: [facility] ENDO ROOM 2 Gender: Male Note Status: Finalized Instrument Name: Upper Endoscope [id number] Procedure:             Upper GI endoscopy Indications:           Esophageal dysphagia, Gastro-esophageal reflux                         disease, Chest pain (non cardiac) Providers:             Boykin Nearing. Norma Fredrickson MD, MD Referring MD:          Boykin Nearing. Norma Fredrickson MD, MD (Referring MD), No Local Md,                         MD (Referring MD) Medicines:             Propofol per Anesthesia Complications:         No immediate complications. Procedure:             Pre-Anesthesia Assessment:                        - The risks and benefits of the procedure and the                         sedation options and risks were discussed with the                         patient. All questions were answered and informed                         consent was obtained.                        - Patient identification and proposed procedure were                         verified prior to the procedure by the nurse. The                         procedure was verified in the procedure room.                        - ASA Grade Assessment: III - A patient with severe                         systemic disease.                        - After reviewing the risks and benefits, the patient                         was deemed in satisfactory condition to undergo the                         procedure.  After obtaining informed consent, the endoscope was                         passed under direct vision. Throughout the procedure,                         the patient's blood pressure, pulse, and oxygen                         saturations were monitored continuously. The Endoscope                          was introduced through the mouth, and advanced to the                         third part of duodenum. The upper GI endoscopy was                         accomplished without difficulty. The patient tolerated                         the procedure well. Findings:      A mild Schatzki ring was found in the distal esophagus. The scope was       withdrawn. Dilation was performed with a Maloney dilator with no       resistance at 54 Fr.      A 1 cm hiatal hernia was present.      The exam of the stomach was otherwise normal.      The examined duodenum was normal.      The exam was otherwise without abnormality. Impression:            - Mild Schatzki ring. Dilated.                        - 1 cm hiatal hernia.                        - Normal examined duodenum.                        - The examination was otherwise normal.                        - No specimens collected. Recommendation:        - Patient has a contact number available for                         emergencies. The signs and symptoms of potential                         delayed complications were discussed with the patient.                         Return to normal activities tomorrow. Written                         discharge instructions were provided to the patient.                        -  Resume previous diet.                        - Continue present medications.                        - Monitor results to esophageal dilation                        - Follow up with Jacob Moores, PA-C in the GI office.                         [phone]                        - Telephone GI office to schedule appointment in 3                         months.                        - The findings and recommendations were discussed with                         the patient. Procedure Code(s):     --- Professional ---                        [phone], Esophagogastroduodenoscopy, flexible,                         transoral; diagnostic, including  collection of                         specimen(s) by brushing or washing, when performed                         (separate procedure)                        43450, Dilation of esophagus, by unguided sound or                         bougie, single or multiple passes Diagnosis Code(s):     --- Professional ---                        R07.89, Other chest pain                        K21.9, Gastro-esophageal reflux disease without                         esophagitis                        R13.14, Dysphagia, pharyngoesophageal phase                        K44.9, Diaphragmatic hernia without obstruction or                         gangrene  K22.2, Esophageal obstruction CPT copyright 2022 American Medical Association. All rights reserved. The codes documented in this report are preliminary and upon coder review may  be revised to meet current compliance requirements. Stanton Kidney MD, MD 11/18/2022 8:53:02 AM This report has been signed electronically. Number of Addenda: 0 Note Initiated On: 11/18/2022 8:26 AM Estimated Blood Loss:  Estimated blood loss: none.      [facility]

## 2022-11-18 NOTE — Anesthesia Postprocedure Evaluation (Signed)
Anesthesia Post Note  Patient: Michael Ibarra  Procedure(s) Performed: ESOPHAGOGASTRODUODENOSCOPY (EGD) WITH PROPOFOL  Patient location during evaluation: PACU Anesthesia Type: General Level of consciousness: awake and awake and alert Pain management: pain level controlled Vital Signs Assessment: post-procedure vital signs reviewed and stable Respiratory status: spontaneous breathing and respiratory function stable Cardiovascular status: blood pressure returned to baseline Anesthetic complications: no   There were no known notable events for this encounter.   Last Vitals:  Vitals:   11/18/22 0909 11/18/22 0911  BP: 116/78   Pulse:    Resp:  18  Temp:    SpO2:      Last Pain:  Vitals:   11/18/22 0901  TempSrc:   PainSc: 0-No pain                 VAN STAVEREN,[place]

## 2022-11-18 NOTE — Transfer of Care (Signed)
Immediate Anesthesia Transfer of Care Note  Patient: Michael Ibarra  Procedure(s) Performed: ESOPHAGOGASTRODUODENOSCOPY (EGD) WITH PROPOFOL  Patient Location: PACU  Anesthesia Type:General  Level of Consciousness: drowsy  Airway & Oxygen Therapy: Patient Spontanous Breathing and Patient connected to nasal cannula oxygen  Post-op Assessment: Report given to RN and Post -op Vital signs reviewed and stable  Post vital signs: stable  Last Vitals:  Vitals Value Taken Time  BP 121/61 11/18/22 0851  Temp 35.8 C 11/18/22 0851  Pulse 64 11/18/22 0851  Resp 15 11/18/22 0851  SpO2 93 % 11/18/22 0851    Last Pain:  Vitals:   11/18/22 0851  TempSrc: Temporal         Complications: No notable events documented.

## 2022-11-18 NOTE — Interval H&P Note (Signed)
History and Physical Interval Note:  11/18/2022 8:27 AM  Michael Ibarra  has presented today for surgery, with the diagnosis of  787.29 (ICD-9-CM) - R13.19 (ICD-10-CM) - Esophageal dysphagia  786.59 (ICD-9-CM) - R07.89 (ICD-10-CM) - Non-cardiac chest pain  530.81 (ICD-9-CM) - K21.9 (ICD-10-CM) - Gastroesophageal reflux disease, unspecified.  The various methods of treatment have been discussed with the patient and family. After consideration of risks, benefits and other options for treatment, the patient has consented to  Procedure(s) with comments: ESOPHAGOGASTRODUODENOSCOPY (EGD) WITH PROPOFOL (N/A) - IDDM as a surgical intervention.  The patient's history has been reviewed, patient examined, no change in status, stable for surgery.  I have reviewed the patient's chart and labs.  Questions were answered to the patient's satisfaction.     [name], [place]

## 2022-11-18 NOTE — Anesthesia Preprocedure Evaluation (Signed)
Anesthesia Evaluation  Patient identified by MRN, date of birth, ID band Patient awake    Reviewed: Allergy & Precautions, NPO status , Patient's Chart, lab work & pertinent test results  History of Anesthesia Complications (+) Emergence Delirium and history of anesthetic complications  Airway Mallampati: II  TM Distance: >3 FB Neck ROM: full    Dental  (+) Missing, Caps,    Pulmonary neg pulmonary ROS, sleep apnea and Continuous Positive Airway Pressure Ventilation    Pulmonary exam normal breath sounds clear to auscultation       Cardiovascular Exercise Tolerance: Good + Peripheral Vascular Disease  negative cardio ROS Normal cardiovascular exam Rhythm:Regular Rate:Normal     Neuro/Psych negative neurological ROS  negative psych ROS   GI/Hepatic negative GI ROS, Neg liver ROS,GERD  Medicated,,  Endo/Other  negative endocrine ROSdiabetes, Type 2, Insulin Dependent    Renal/GU negative Renal ROS  negative genitourinary   Musculoskeletal   Abdominal  (+) + obese  Peds negative pediatric ROS (+)  Hematology negative hematology ROS (+)   Anesthesia Other Findings Past Medical History: No date: Arthritis No date: Cancer     Comment:  prostate No date: Complication of anesthesia     Comment:  Delayed confusion after prostate surgery No date: Diabetes mellitus without complication No date: GERD (gastroesophageal reflux disease) No date: Sleep apnea     Comment:  CPAP  Past Surgical History: No date: BACK SURGERY     Comment:  Cervical fusion 09/27/2018: COLONOSCOPY WITH PROPOFOL; N/A     Comment:  Procedure: COLONOSCOPY WITH PROPOFOL;  Surgeon: Midge Minium, MD;  Location: [facility] ENDOSCOPY;  Service:               Endoscopy;  Laterality: N/A; 10/13/2021: COLONOSCOPY WITH PROPOFOL; N/A     Comment:  Procedure: COLONOSCOPY WITH PROPOFOL;  Surgeon: Midge Minium, MD;  Location: [facility];  Service:               Endoscopy;  Laterality: N/A;  ASCENDING COLON POLYP X 2--              1 ONE POLYP NOT RETRIEVED 10/13/2021: POLYPECTOMY     Comment:  Procedure: POLYPECTOMY;  Surgeon: Midge Minium, MD;                Location: [facility];  Service: Endoscopy;; No date: PROSTECTOMY     Reproductive/Obstetrics negative OB ROS                             Anesthesia Physical Anesthesia Plan  ASA: 3  Anesthesia Plan: General   Post-op Pain Management:    Induction: Intravenous  PONV Risk Score and Plan: Propofol infusion and TIVA  Airway Management Planned: Natural Airway  Additional Equipment:   Intra-op Plan:   Post-operative Plan:   Informed Consent: I have reviewed the patients History and Physical, chart, labs and discussed the procedure including the risks, benefits and alternatives for the proposed anesthesia with the patient or authorized representative who has indicated his/her understanding and acceptance.     Dental Advisory Given  Plan Discussed with: CRNA and Surgeon  Anesthesia Plan Comments:        Anesthesia Quick Evaluation

## 2022-11-19 ENCOUNTER — Encounter: Payer: Self-pay | Admitting: Internal Medicine

## 2022-12-09 ENCOUNTER — Encounter: Payer: Self-pay | Admitting: Internal Medicine

## 2023-04-21 ENCOUNTER — Ambulatory Visit: Payer: Medicare Other | Attending: Infectious Diseases

## 2023-04-26 ENCOUNTER — Ambulatory Visit: Payer: Medicare Other

## 2023-04-28 ENCOUNTER — Ambulatory Visit: Payer: Medicare Other

## 2023-05-03 ENCOUNTER — Ambulatory Visit: Payer: Medicare Other

## 2023-05-05 ENCOUNTER — Ambulatory Visit: Payer: Medicare Other

## 2023-05-10 ENCOUNTER — Ambulatory Visit: Payer: Medicare Other

## 2023-07-23 ENCOUNTER — Other Ambulatory Visit: Payer: Self-pay | Admitting: Infectious Diseases

## 2023-07-23 DIAGNOSIS — R768 Other specified abnormal immunological findings in serum: Secondary | ICD-10-CM

## 2023-07-23 DIAGNOSIS — R1032 Left lower quadrant pain: Secondary | ICD-10-CM

## 2023-08-09 ENCOUNTER — Ambulatory Visit
Admission: RE | Admit: 2023-08-09 | Discharge: 2023-08-09 | Disposition: A | Discharge status: Home / Self Care | Payer: Medicare Other | Source: Ambulatory Visit | Attending: Infectious Diseases | Admitting: Infectious Diseases

## 2023-08-09 DIAGNOSIS — R768 Other specified abnormal immunological findings in serum: Secondary | ICD-10-CM | POA: Insufficient documentation

## 2023-08-09 DIAGNOSIS — R1032 Left lower quadrant pain: Secondary | ICD-10-CM | POA: Insufficient documentation

## 2024-07-29 ENCOUNTER — Emergency Department

## 2024-07-29 ENCOUNTER — Observation Stay
Admission: EM | Admit: 2024-07-29 | Discharge: 2024-07-31 | Disposition: A | Attending: Emergency Medicine | Admitting: Emergency Medicine

## 2024-07-29 ENCOUNTER — Other Ambulatory Visit: Payer: Self-pay

## 2024-07-29 DIAGNOSIS — Z794 Long term (current) use of insulin: Secondary | ICD-10-CM | POA: Insufficient documentation

## 2024-07-29 DIAGNOSIS — E785 Hyperlipidemia, unspecified: Secondary | ICD-10-CM | POA: Diagnosis not present

## 2024-07-29 DIAGNOSIS — J101 Influenza due to other identified influenza virus with other respiratory manifestations: Secondary | ICD-10-CM | POA: Diagnosis not present

## 2024-07-29 DIAGNOSIS — M6281 Muscle weakness (generalized): Secondary | ICD-10-CM | POA: Diagnosis not present

## 2024-07-29 DIAGNOSIS — Z7984 Long term (current) use of oral hypoglycemic drugs: Secondary | ICD-10-CM | POA: Diagnosis not present

## 2024-07-29 DIAGNOSIS — R4182 Altered mental status, unspecified: Secondary | ICD-10-CM | POA: Diagnosis present

## 2024-07-29 DIAGNOSIS — K219 Gastro-esophageal reflux disease without esophagitis: Secondary | ICD-10-CM

## 2024-07-29 DIAGNOSIS — R531 Weakness: Secondary | ICD-10-CM | POA: Diagnosis not present

## 2024-07-29 DIAGNOSIS — Z8546 Personal history of malignant neoplasm of prostate: Secondary | ICD-10-CM | POA: Insufficient documentation

## 2024-07-29 DIAGNOSIS — E119 Type 2 diabetes mellitus without complications: Secondary | ICD-10-CM | POA: Insufficient documentation

## 2024-07-29 LAB — URINALYSIS, W/ REFLEX TO CULTURE (INFECTION SUSPECTED)
Bacteria, UA: NONE SEEN
Bilirubin Urine: NEGATIVE
Glucose, UA: >=500 mg/dL — AB
Hgb urine dipstick: NEGATIVE
Ketones, ur: 5 mg/dL — AB
Leukocytes,Ua: NEGATIVE
Nitrite: NEGATIVE
Protein, ur: NEGATIVE mg/dL
RBC / HPF: 0 RBC/hpf (ref 0–5)
Specific Gravity, Urine: 1.025 (ref 1.005–1.030)
Squamous Epithelial / HPF: 0 /HPF (ref 0–5)
pH: 5 (ref 5.0–8.0)

## 2024-07-29 LAB — COMPREHENSIVE METABOLIC PANEL WITH GFR
ALT: 22 U/L (ref 0–44)
AST: 28 U/L (ref 15–41)
Albumin: 4 g/dL (ref 3.5–5.0)
Alkaline Phosphatase: 55 U/L (ref 38–126)
Anion gap: 12 (ref 5–15)
BUN: 17 mg/dL (ref 8–23)
CO2: 24 mmol/L (ref 22–32)
Calcium: 8.6 mg/dL — ABNORMAL LOW (ref 8.9–10.3)
Chloride: 98 mmol/L (ref 98–111)
Creatinine, Ser: 1.54 mg/dL — ABNORMAL HIGH (ref 0.61–1.24)
GFR, Estimated: 45 mL/min — ABNORMAL LOW
Glucose, Bld: 188 mg/dL — ABNORMAL HIGH (ref 70–99)
Potassium: 4.2 mmol/L (ref 3.5–5.1)
Sodium: 134 mmol/L — ABNORMAL LOW (ref 135–145)
Total Bilirubin: 0.6 mg/dL (ref 0.0–1.2)
Total Protein: 6.8 g/dL (ref 6.5–8.1)

## 2024-07-29 LAB — CBC WITH DIFFERENTIAL/PLATELET
Abs Immature Granulocytes: 0.03 K/uL (ref 0.00–0.07)
Basophils Absolute: 0 K/uL (ref 0.0–0.1)
Basophils Relative: 0 %
Eosinophils Absolute: 0 K/uL (ref 0.0–0.5)
Eosinophils Relative: 0 %
HCT: 32 % — ABNORMAL LOW (ref 39.0–52.0)
Hemoglobin: 10.4 g/dL — ABNORMAL LOW (ref 13.0–17.0)
Immature Granulocytes: 0 %
Lymphocytes Relative: 12 %
Lymphs Abs: 0.9 K/uL (ref 0.7–4.0)
MCH: 33.9 pg (ref 26.0–34.0)
MCHC: 32.5 g/dL (ref 30.0–36.0)
MCV: 104.2 fL — ABNORMAL HIGH (ref 80.0–100.0)
Monocytes Absolute: 0.6 K/uL (ref 0.1–1.0)
Monocytes Relative: 8 %
Neutro Abs: 6.1 K/uL (ref 1.7–7.7)
Neutrophils Relative %: 80 %
Platelets: 94 K/uL — ABNORMAL LOW (ref 150–400)
RBC: 3.07 MIL/uL — ABNORMAL LOW (ref 4.22–5.81)
RDW: 13.7 % (ref 11.5–15.5)
WBC: 7.7 K/uL (ref 4.0–10.5)
nRBC: 0 % (ref 0.0–0.2)

## 2024-07-29 LAB — LACTIC ACID, PLASMA: Lactic Acid, Venous: 2.8 mmol/L (ref 0.5–1.9)

## 2024-07-29 MED ORDER — ACETAMINOPHEN 325 MG PO TABS: 650.0000 mg | ORAL_TABLET | Freq: Four times a day (QID) | ORAL | Status: DC | PRN

## 2024-07-29 MED ORDER — ONDANSETRON HCL 4 MG PO TABS: 4.0000 mg | ORAL_TABLET | Freq: Four times a day (QID) | ORAL | Status: DC | PRN

## 2024-07-29 MED ORDER — INSULIN GLARGINE 100 UNIT/ML ~~LOC~~ SOLN
60.0000 [IU] | Freq: Every day | SUBCUTANEOUS | Status: DC
  Administered 2024-07-30 – 2024-07-31 (×2): 60 [IU] via SUBCUTANEOUS
  Filled 2024-07-29 (×2): qty 0.6

## 2024-07-29 MED ORDER — MAGNESIUM HYDROXIDE 400 MG/5ML PO SUSP: 30.0000 mL | Freq: Every day | ORAL | Status: DC | PRN

## 2024-07-29 MED ORDER — LINAGLIPTIN 5 MG PO TABS
5.0000 mg | ORAL_TABLET | Freq: Every day | ORAL | Status: DC
  Administered 2024-07-30 – 2024-07-31 (×2): 5 mg via ORAL
  Filled 2024-07-29 (×2): qty 1

## 2024-07-29 MED ORDER — PANTOPRAZOLE SODIUM 40 MG PO TBEC
40.0000 mg | DELAYED_RELEASE_TABLET | Freq: Every day | ORAL | Status: DC
  Administered 2024-07-30 – 2024-07-31 (×2): 40 mg via ORAL
  Filled 2024-07-29 (×2): qty 1

## 2024-07-29 MED ORDER — ENOXAPARIN SODIUM 40 MG/0.4ML IJ SOSY
40.0000 mg | PREFILLED_SYRINGE | INTRAMUSCULAR | Status: DC
  Administered 2024-07-30 – 2024-07-31 (×2): 40 mg via SUBCUTANEOUS
  Filled 2024-07-29 (×2): qty 0.4

## 2024-07-29 MED ORDER — IBUPROFEN 600 MG PO TABS
600.0000 mg | ORAL_TABLET | Freq: Once | ORAL | Status: AC
  Administered 2024-07-29: 600 mg via ORAL
  Filled 2024-07-29: qty 1

## 2024-07-29 MED ORDER — ACETAMINOPHEN 650 MG RE SUPP: 650.0000 mg | Freq: Four times a day (QID) | RECTAL | Status: DC | PRN

## 2024-07-29 MED ORDER — SODIUM CHLORIDE 0.9 % IV BOLUS
1000.0000 mL | Freq: Once | INTRAVENOUS | Status: AC
  Administered 2024-07-29: 1000 mL via INTRAVENOUS

## 2024-07-29 MED ORDER — SODIUM CHLORIDE 0.9 % IV SOLN: INTRAVENOUS | Status: AC

## 2024-07-29 MED ORDER — TRAZODONE HCL 50 MG PO TABS: 25.0000 mg | ORAL_TABLET | Freq: Every evening | ORAL | Status: DC | PRN

## 2024-07-29 MED ORDER — ASPIRIN 81 MG PO CHEW
81.0000 mg | CHEWABLE_TABLET | Freq: Every day | ORAL | Status: DC
  Administered 2024-07-30 – 2024-07-31 (×2): 81 mg via ORAL
  Filled 2024-07-29 (×2): qty 1

## 2024-07-29 MED ORDER — ONDANSETRON HCL 4 MG/2ML IJ SOLN: 4.0000 mg | Freq: Four times a day (QID) | INTRAMUSCULAR | Status: DC | PRN

## 2024-07-29 MED ORDER — VITAMIN D 25 MCG (1000 UNIT) PO TABS
2000.0000 [IU] | ORAL_TABLET | Freq: Every day | ORAL | Status: DC
  Administered 2024-07-30 – 2024-07-31 (×2): 2000 [IU] via ORAL
  Filled 2024-07-29 (×2): qty 2

## 2024-07-29 NOTE — ED Notes (Signed)
 Pt noted to have an extrememly ataxic gait and leaning to the right. Last known normal 3 days ago. A&Ox4

## 2024-07-29 NOTE — Progress Notes (Signed)
 " History of Present Illness:   Michael Ibarra is a 83 y.o. male here for flulike symptoms.  He reports runny nose, postnasal drainage, that is causing a cough.  He reports body aches, feeling dizzy and unsteady, and fatigue.  He had a fever of 102 F this morning.  He denies any sick contacts.  He did get his flu shot this year.  He is otherwise eating and drinking well.   Past Medical History:   Past Medical History:  Diagnosis Date   Arthritis    Diabetes mellitus type 2, uncomplicated (CMS/HHS-HCC)    Spinal stenosis    Squamous cell cancer of skin of right hand    and right arm   Ventral hernia     Past Surgical History:   Past Surgical History:  Procedure Laterality Date   LAPAROSCOPIC PROSTATECTOMY ROBOTIC N/A 08/21/2015   Procedure: ROBOTIC ASSISTED LAPAROSCOPIC PROSTATECTOMY ;  Surgeon: Ozell Johnye Hoffmann, MD;  Location: [facility];  Service: Urology;  Laterality: N/A;   LAPAROSCOPIC PELVIC LYMPH NODE SAMPLING N/A 08/21/2015   Procedure: LAPAROSCOPIC PELVIC LYMPH NODE SAMPLING;  Surgeon: Ozell Johnye Hoffmann, MD;  Location: [facility];  Service: Urology;  Laterality: N/A;   ENDOSCOPY BILE DUCT  11/18/2022   GERD/No Repeat/TKT   ANKLE ARTHRODESIS Left    ARTHROSCOPIC ROTATOR CUFF REPAIR Right    penile implant     skin cancer     squamous cell removed right lower arm (Sept 2023) and right hand Aug 2023   SPINE SURGERY     C6-7    Allergies:   Allergies  Allergen Reactions   Morphine Headache   Oxycodone Hallucination   Sulfa (Sulfonamide Antibiotics) Hives    Current Medications:   Prior to Admission medications  Medication Sig Taking? Last Dose  aspirin  81 MG EC tablet Take 1 tablet (81 mg total) by mouth once daily Yes Taking  blood glucose diagnostic test strip 1 each (1 strip total) 2 (two) times daily Use as instructed. Yes PRN Not Currently Taking  blood glucose meter kit as directed Yes PRN Not Currently Taking   blood-glucose meter,continuous (FREESTYLE LIBRE 3 READER) Misc Use 1 each once daily Yes PRN Not Currently Taking  blood-glucose sensor (FREESTYLE LIBRE 3 PLUS SENSOR) Devi Use 1 each every 15 (fifteen) days Yes PRN Not Currently Taking  CONCENTRATED insulin  glargine (TOUJEO  SOLOSTAR U-300 INSULIN ) pen injector (concentration 300 units/mL) Inject 62 Units subcutaneously once daily Yes PRN Not Currently Taking  dapagliflozin  propanediol (FARXIGA ) 10 mg tablet Take 1 tablet (10 mg total) by mouth once daily Yes Taking  HYDROcodone -acetaminophen  (NORCO) 5-325 mg tablet Take 1 tablet by mouth every 6 (six) hours as needed for Pain Yes PRN Not Currently Taking  HYDROcodone -acetaminophen  (NORCO) 5-325 mg tablet Take 1 tablet by mouth every 6 (six) hours as needed for Pain Yes PRN Not Currently Taking  HYDROcodone -acetaminophen  (NORCO) 5-325 mg tablet Take 1 tablet by mouth every 6 (six) hours as needed for Pain Yes PRN Not Currently Taking  JANUVIA  100 mg tablet TAKE 1 TABLET(100 MG) BY MOUTH DAILY Yes Taking  lancets Use 1 each 2 (two) times daily Use as instructed. Yes PRN Not Currently Taking  linaCLOtide (LINZESS) 72 mcg capsule Take 1 capsule (72 mcg total) by mouth once daily as needed Yes PRN Not Currently Taking  metFORMIN  (GLUCOPHAGE ) 1000 MG tablet Take 1 tablet (1,000 mg total) by mouth 2 (two) times daily with meals Yes Taking  pantoprazole  (PROTONIX ) 40 MG DR  tablet Take 1 tablet (40 mg total) by mouth once daily Yes Taking  pen needle, diabetic (BD ULTRA-FINE NANO PEN NEEDLE) 32 gauge x 5/32 Ndle Use daily as directed Yes PRN Not Currently Taking  sildenafiL (VIAGRA) 100 MG tablet Take 1 tablet (100 mg total) by mouth once daily as needed for Erectile Dysfunction Yes PRN Not Currently Taking  oseltamivir  (TAMIFLU ) 75 MG capsule Take 1 capsule (75 mg total) by mouth 2 (two) times daily for 5 days    promethazine-dextromethorphan  (PROMETHAZINE-DM) 6.25-15 mg/5 mL syrup Take 5 mLs by mouth  every 8 (eight) hours as needed for Cough      Family History:   Family History  Problem Relation Name Age of Onset   Anesthesia problems Neg Hx      Social History:   Social History   Socioeconomic History   Marital status: Married  Tobacco Use   Smoking status: Never   Smokeless tobacco: Never  Vaping Use   Vaping status: Never Used  Substance and Sexual Activity   Alcohol use: No   Drug use: No   Sexual activity: Never   Social Drivers of Corporate Investment Banker Strain: Low Risk  (06/16/2024)   Overall Financial Resource Strain (CARDIA)    Difficulty of Paying Living Expenses: Not hard at all  Food Insecurity: No Food Insecurity (06/16/2024)   Hunger Vital Sign    Worried About Running Out of Food in the Last Year: Never true    Ran Out of Food in the Last Year: Never true  Transportation Needs: No Transportation Needs (06/16/2024)   PRAPARE - Administrator, Civil Service (Medical): No    Lack of Transportation (Non-Medical): No  Physical Activity: Sufficiently Active (06/20/2021)   Received from [facility]   Exercise Vital Sign    On average, how many days per week do you engage in moderate to strenuous exercise (like a brisk walk)?: 5 days    On average, how many minutes do you engage in exercise at this level?: 30 min  Stress: No Stress Concern Present (06/20/2021)   Received from [facility] - Occupational Stress Questionnaire    Feeling of Stress : Not at all  Social Connections: Moderately Integrated (06/20/2021)   Received from [facility]   Social Connection and Isolation Panel    In a typical week, how many times do you talk on the phone with family, friends, or neighbors?: More than three times a week    How often do you get together with friends or relatives?: More than three times a week    How often do you attend church or religious services?: More than 4 times per year     Do you belong to any clubs or organizations such as church groups, unions, fraternal or athletic groups, or school groups?: No    How often do you attend meetings of the clubs or organizations you belong to?: Never    Are you married, widowed, divorced, separated, never married, or living with a partner?: Married  Housing Stability: Low Risk  (06/16/2024)   Housing Stability Vital Sign    Unable to Pay for Housing in the Last Year: No    Number of Times Moved in the Last Year: 0    Homeless in the Last Year: No    Review of Systems:   Review of Systems  Constitutional:  Positive for chills and fever.  HENT:  Positive  for congestion, postnasal drip and rhinorrhea. Negative for ear pain and sore throat.   Eyes:  Negative for pain.  Respiratory:  Positive for cough. Negative for shortness of breath and wheezing.   Gastrointestinal:  Negative for abdominal pain.  Musculoskeletal:  Positive for myalgias.  Skin:  Negative for rash.  Neurological:  Positive for dizziness. Negative for light-headedness and headaches.  All other systems reviewed and are negative.   Vitals:   Vitals:   07/29/24 1001 07/29/24 1004  BP: 119/73   Pulse: 98   Temp: 37.1 C (98.7 F)   TempSrc: Oral   SpO2: 92% 96%  Weight: 92.5 kg (204 lb)   Height: 182.9 cm (6')      Body mass index is 27.67 kg/m.  Physical Exam:   Physical Exam Vitals and nursing note reviewed.  Constitutional:      General: He is not in acute distress.    Appearance: He is well-developed. He is not diaphoretic.  HENT:     Head: Normocephalic and atraumatic.     Right Ear: Hearing, tympanic membrane, ear canal and external ear normal.     Left Ear: Hearing, tympanic membrane, ear canal and external ear normal.     Nose: Congestion and rhinorrhea present.     Mouth/Throat:     Pharynx: Oropharynx is clear. Uvula midline. No oropharyngeal exudate or posterior oropharyngeal erythema.     Tonsils: No tonsillar  abscesses.  Eyes:     General: No scleral icterus.       Right eye: No discharge.        Left eye: No discharge.     Conjunctiva/sclera: Conjunctivae normal.     Pupils: Pupils are equal, round, and reactive to light.  Cardiovascular:     Rate and Rhythm: Normal rate and regular rhythm.     Heart sounds: No murmur heard.    No friction rub. No gallop.  Pulmonary:     Effort: Pulmonary effort is normal. No respiratory distress.     Breath sounds: Normal breath sounds. No wheezing.  Chest:     Chest wall: No tenderness.  Abdominal:     Comments:    Musculoskeletal:        General: Normal range of motion.     Cervical back: Normal range of motion and neck supple.  Lymphadenopathy:     Cervical: No cervical adenopathy.  Skin:    General: Skin is warm and dry.  Neurological:     Mental Status: He is alert and oriented to person, place, and time.  Psychiatric:        Behavior: Behavior normal.        Thought Content: Thought content normal.        Judgment: Judgment normal.     Assessment and Plan:   Results for orders placed or performed in visit on 07/29/24  Xpert Dx Strep A, PCR - [facility]   Specimen: Throat  Result Value Ref Range   Xpress Strep A, PCR Not Detected Not Detected, INVALID  Extended Respiratory Viral Panel - [facility]   Specimen: Nasal Swab; Other  Result Value Ref Range   Influenza A PCR Positive (!) Negative   Influenza B PCR Negative Negative   RSV PCR Negative Negative   SARS-CoV2 PCR Negative Negative   Narrative   Positive  Positive results are indicative of the presence of the identified virus, but do not rule out bacterial infection or co-infection with other pathogens not detected by the test.  Clinical correlation with patient history and other diagnostic information is necessary to determine patient infection status.  Negative  Negative results do not preclude SARS-CoV-2 infection, Influenza A virus, Influenza B virus and/or RSV infection  and should not be used as the sole basis for treatment or other patient management decisions. Negative results must be combined with clinical observations, patient history, and/or epidemiological information.     Urinalysis w/Microscopic  Result Value Ref Range   Color Yellow Colorless, Straw, Light Yellow, Yellow, Dark Yellow   Clarity Clear Clear   Specific Gravity 1.020 1.000 - 1.030   pH, Urine 5.0 5.0 - 8.0   Protein, Urinalysis Negative Negative, Trace mg/dL   Glucose, Urinalysis >=8999 (!) Negative mg/dL   Ketones, Urinalysis Negative Negative mg/dL   Blood, Urinalysis Small (!) Negative   Nitrite, Urinalysis Negative Negative   Leukocyte Esterase, Urinalysis Negative Negative   White Blood Cells, Urinalysis None Seen None Seen, 0-3 /hpf   Red Blood Cells, Urinalysis 0-3 None Seen, 0-3 /hpf   Bacteria, Urinalysis None Seen None Seen /hpf   Squamous Epithelial Cells, Urinalysis None Seen Rare, Few, None Seen /hpf    Diagnoses and all orders for this visit:  Influenza A  Acute cough  Encntr for obs for susp expsr to oth biolg agents ruled out -     Extended Respiratory Viral Panel - [facility]; Future  Sore throat -     Xpert Dx Strep A, PCR - [facility]  Dysuria -     Urinalysis w/Microscopic  Other orders -     oseltamivir  (TAMIFLU ) 75 MG capsule; Take 1 capsule (75 mg total) by mouth 2 (two) times daily for 5 days -     promethazine-dextromethorphan  (PROMETHAZINE-DM) 6.25-15 mg/5 mL syrup; Take 5 mLs by mouth every 8 (eight) hours as needed for Cough    Patient Instructions  POSITIVE for influenza. Take medications as directed. Return to care should your symptoms not improve, or worsen in any way.    Portions of this note were created using dictation software and may contain typographical errors.   Patient received an After Visit Summary    "

## 2024-07-29 NOTE — H&P (Signed)
 "     [place]   PATIENT NAME: Michael Ibarra    MR#:  [id number]  DATE OF BIRTH:  [birth year]  DATE OF ADMISSION:  07/29/2024  PRIMARY CARE PHYSICIAN: Epifanio Alm SQUIBB, MD   Patient is coming from: Home  REQUESTING/REFERRING PHYSICIAN: Dorothyann Drivers, MD   CHIEF COMPLAINT:   Chief Complaint  Patient presents with   Fever    HISTORY OF PRESENT ILLNESS:  Michael Ibarra is a 83 y.o. Caucasian male with medical history significant for osteoarthritis, type 2 diabetes mellitus, GERD, OSA on CPAP, who presented to the emergency room with new onset of generalized weakness since Christmas night when he started feeling bad.  He has been having cough productive of yellow sputum without significant dyspnea or wheezing.  Today he had difficulty ambulating after being seen at [facility] clinic and diagnosed with influenza A.  He admitted to fever and chills.  No nausea or vomiting or abdominal pain.  No dysuria, oliguria or hematuria or flank pain.  ED Course: When he came to the ER, temperature was 102.6 and later 98.5 after 600 mg of p.o. ibuprofen , BP was 141/72 and heart rate 101 with pulse oximetry of 95% on room air and respiratory rate of 20.  Labs revealed sodium 134 and glucose 188, creatinine 1.54 and calcium  8.6.  Lactic acid was 2.8 and later 1.  CBC showed mild anemia with hemoglobin 10.4 hematocrit 32.  UA showed more than 500 glucose EKG as reviewed by me : None. Imaging: Chest x-ray showed no acute findings.  The patient was given 1 L bolus of IV normal saline in addition to ibuprofen .  He will be admitted to a telemetry observation bed for further evaluation and management. PAST MEDICAL HISTORY:   Past Medical History:  Diagnosis Date   Arthritis    Cancer (HCC)    prostate   Complication of anesthesia    Delayed confusion after prostate surgery   Diabetes mellitus without complication (HCC)    GERD (gastroesophageal reflux disease)    Sleep apnea    CPAP     PAST SURGICAL HISTORY:   Past Surgical History:  Procedure Laterality Date   BACK SURGERY     Cervical fusion   COLONOSCOPY WITH PROPOFOL  N/A 09/27/2018   Procedure: COLONOSCOPY WITH PROPOFOL ;  Surgeon: Jinny Carmine, MD;  Location: [facility];  Service: Endoscopy;  Laterality: N/A;   COLONOSCOPY WITH PROPOFOL  N/A 10/13/2021   Procedure: COLONOSCOPY WITH PROPOFOL ;  Surgeon: Jinny Carmine, MD;  Location: [facility];  Service: Endoscopy;  Laterality: N/A;  ASCENDING COLON POLYP X 2-- 1 ONE POLYP NOT RETRIEVED   ESOPHAGOGASTRODUODENOSCOPY (EGD) WITH PROPOFOL  N/A 11/18/2022   Procedure: ESOPHAGOGASTRODUODENOSCOPY (EGD) WITH PROPOFOL ;  Surgeon: Toledo, Ladell POUR, MD;  Location: [facility];  Service: Gastroenterology;  Laterality: N/A;  IDDM   POLYPECTOMY  10/13/2021   Procedure: POLYPECTOMY;  Surgeon: Jinny Carmine, MD;  Location: [facility];  Service: Endoscopy;;   PROSTECTOMY      SOCIAL HISTORY:   Social History   Tobacco Use   Smoking status: Never   Smokeless tobacco: Never  Substance Use Topics   Alcohol use: Never    FAMILY HISTORY:  Positive coronary artery disease, hypertension and diabetes mellitus.  DRUG ALLERGIES:  Allergies[1]  REVIEW OF SYSTEMS:   ROS As per history of present illness. All pertinent systems were reviewed above. Constitutional, HEENT, cardiovascular, respiratory, GI, GU, musculoskeletal, neuro, psychiatric, endocrine, integumentary and hematologic systems were reviewed and are otherwise  negative/unremarkable except for positive findings mentioned above in the HPI.   MEDICATIONS AT HOME:   Prior to Admission medications  Medication Sig Start Date End Date Taking? Authorizing Provider  aspirin  81 MG chewable tablet Chew by mouth.   Yes [provider]  atorvastatin  (LIPITOR) 40 MG tablet Take 1 tablet (40 mg total) by mouth daily. Patient not taking: Reported on 07/29/2024 05/05/21   Britta King, MD  B-D  ULTRAFINE III SHORT PEN 31G X 8 MM MISC USE 1 PEN NEEDLE DAILY 12/15/21   Britta King, MD  Cholecalciferol  50 MCG (2000 UT) TABS Take by mouth.    [provider]  glucose blood (ONETOUCH ULTRA) test strip Use to check blood sugar twice daily 01/24/20   Masoud, Javed, MD  HYDROcodone -acetaminophen  (NORCO) 10-325 MG tablet Take by mouth. Patient not taking: Reported on 10/06/2021    [provider]  insulin  glargine, 1 Unit Dial, (TOUJEO  SOLOSTAR) 300 UNIT/ML Solostar Pen INJECT 60 UNITS UNDER THE SKIN DAILY 04/30/21   Britta King, MD  metFORMIN  (GLUCOPHAGE ) 1000 MG tablet TAKE 1 TABLET BY MOUTH TWICE DAILY Patient taking differently: 1,000 mg. Taking once daily 06/15/21   Britta King, MD  pantoprazole  (PROTONIX ) 40 MG tablet Take 1 tablet (40 mg total) by mouth daily. 04/30/21   Britta King, MD  pantoprazole  (PROTONIX ) 40 MG tablet Take 1 tablet by mouth daily. 04/04/20   [provider]  sitaGLIPtin  (JANUVIA ) 100 MG tablet Take 1 tablet (100 mg total) by mouth daily. 04/30/21   Britta King, MD      VITAL SIGNS:  Blood pressure 124/62, pulse 75, temperature 98.4 F (36.9 C), resp. rate 16, height 6' (1.829 m), weight 95.7 kg, SpO2 93%.  PHYSICAL EXAMINATION:  Physical Exam  GENERAL:  83 y.o.-year-old Caucasian male patient lying in the bed with no acute distress.  EYES: Pupils equal, round, reactive to light and accommodation. No scleral icterus. Extraocular muscles intact.  HEENT: Head atraumatic, normocephalic. Oropharynx and nasopharynx clear.  NECK:  Supple, no jugular venous distention. No thyroid  enlargement, no tenderness.  LUNGS: Normal breath sounds bilaterally, no wheezing, rales,rhonchi or crepitation. No use of accessory muscles of respiration.  CARDIOVASCULAR: Regular rate and rhythm, S1, S2 normal. No murmurs, rubs, or gallops.  ABDOMEN: Soft, nondistended, nontender. Bowel sounds present. No organomegaly or mass.  EXTREMITIES: No pedal edema,  cyanosis, or clubbing.  NEUROLOGIC: Cranial nerves II through XII are intact. Muscle strength 5/5 in all extremities. Sensation intact. Gait not checked.  PSYCHIATRIC: The patient is alert and oriented x 3.  Normal affect and good eye contact. SKIN: No obvious rash, lesion, or ulcer.   LABORATORY PANEL:   CBC Recent Labs  Lab 07/29/24 2032  WBC 7.7  HGB 10.4*  HCT 32.0*  PLT 94*   ------------------------------------------------------------------------------------------------------------------  Chemistries  Recent Labs  Lab 07/29/24 2143  NA 134*  K 4.2  CL 98  CO2 24  GLUCOSE 188*  BUN 17  CREATININE 1.54*  CALCIUM  8.6*  AST 28  ALT 22  ALKPHOS 55  BILITOT 0.6   ------------------------------------------------------------------------------------------------------------------  Cardiac Enzymes No results for input(s): TROPONINI in the last 168 hours. ------------------------------------------------------------------------------------------------------------------  RADIOLOGY:  DG Chest Port 1 View Result Date: 07/29/2024 EXAM: 1 VIEW(S) XRAY OF THE CHEST 07/29/2024 08:41:00 PM COMPARISON: 11/04/2017 CLINICAL HISTORY: Fever FINDINGS: LUNGS AND PLEURA: Low lung volume. No focal pulmonary opacity. No pleural effusion. No pneumothorax. HEART AND MEDIASTINUM: Aortic arch calcifications. No acute abnormality of the cardiac and mediastinal silhouettes. BONES  AND SOFT TISSUES: Lower cervical spinal fixation hardware in place. No acute osseous abnormality. IMPRESSION: 1. No acute findings. Electronically signed by: Morgane Naveau MD 07/29/2024 09:29 PM EST RP Workstation: [id number]      IMPRESSION AND PLAN:  Assessment and Plan: * Influenza A - This associated with significant generalized weakness.  He will be admitted to a medical telemetry observation bed. - Will be placed on p.o. Tamiflu . - Will hydrate with normal saline. - Mucolytic therapy and supportive therapy  will be provided.  Type 2 diabetes mellitus without complications (HCC) - Will place the patient on supplemental coverage with NovoLog . - Will continue basal coverage. - Will continue Farxiga  and hold off metformin .  GERD without esophagitis - Continue PPI therapy.  Dyslipidemia - Will continue statin therapy.   DVT prophylaxis: Lovenox .  Advanced Care Planning:  Code Status: full code.  Family Communication:  The plan of care was discussed in details with the patient (and family). I answered all questions. The patient agreed to proceed with the above mentioned plan. Further management will depend upon hospital course. Disposition Plan: Back to previous home environment Consults called: none.  All the records are reviewed and case discussed with ED provider.  Status is: Observation  I certify that at the time of admission, it is my clinical judgment that the patient will require hospital care extending less than 2 midnights.                            Dispo: The patient is from: Home              Anticipated d/c is to: Home              Patient currently is not medically stable to d/c.              Difficult to place patient: No  Madison DELENA Peaches M.D on 07/30/2024 at 4:00 AM  [facility]   From 7 PM-7 AM, contact night-coverage [email or web address]  CC: Primary care physician; Epifanio Alm SQUIBB, MD     [1]  Allergies Allergen Reactions   Morphine And Codeine Other (See Comments)    Headaches  (pt denies 10/06/21)   Oxycodone Other (See Comments)    Other reaction(s): Hallucination (Pt denies 10/06/21)   Sulfa Antibiotics Other (See Comments), Hives and Itching    headaches   "

## 2024-07-29 NOTE — ED Notes (Signed)
 This tech answered pt call light. Pt and family said that the monitor was beeping but that it had stopped. Family stated, He usually takes a baby aspirin  because of his diabetes to prevent a stroke or whatever else, so can he have that now? It should in his list of medications. Now might be a good time to take it. Family reassured that the pts nurse will be notified. No further requests at this time.

## 2024-07-29 NOTE — ED Triage Notes (Signed)
 First RN Note: Pt to ED via [facility]. Per EMS pt seen at [facility] and was dx with flu. PTA pt urinated on himself. Per EMS pt reports has been urinating more than normal, EMS reports +cough with productive sputum. Pt is notedly weak for EMS. (-)stroke screen. Per EMS pt febrile en route last dose of Tylenol  2hrs PTA.     101 oral 105HR 133/71  20G to R forearm 500cc LR given en route.

## 2024-07-29 NOTE — ED Notes (Addendum)
 ED TO INPATIENT HANDOFF REPORT  ED Nurse Name and Phone #: [phone]  S Name/Age/Gender Michael Ibarra 83 y.o. male Room/Bed: ED07A/ED07A  Code Status   Code Status: Full Code  Home/SNF/Other Home Patient oriented to: self and place Is this baseline? No   Triage Complete: Triage complete  Chief Complaint Influenza A [J10.1]  Triage Note First RN Note: Pt to ED via [facility]. Per EMS pt seen at [facility] and was dx with flu. PTA pt urinated on himself. Per EMS pt reports has been urinating more than normal, EMS reports +cough with productive sputum. Pt is notedly weak for EMS. (-)stroke screen. Per EMS pt febrile en route last dose of Tylenol  2hrs PTA.     101 oral 105HR 133/71  20G to R forearm 500cc LR given en route.    Allergies Allergies[1]  Level of Care/Admitting Diagnosis ED Disposition     ED Disposition  Admit   Condition  --   Comment  Hospital Area: [facility] [[id number]]  Level of Care: Telemetry [5]  Diagnosis: Influenza A [723124]  Admitting Physician: LAWENCE MADISON LABOR [[id number]]  Attending Physician: LAWENCE MADISON LABOR [[id number]]          B Medical/Surgery History Past Medical History:  Diagnosis Date   Arthritis    Cancer (HCC)    prostate   Complication of anesthesia    Delayed confusion after prostate surgery   Diabetes mellitus without complication (HCC)    GERD (gastroesophageal reflux disease)    Sleep apnea    CPAP   Past Surgical History:  Procedure Laterality Date   BACK SURGERY     Cervical fusion   COLONOSCOPY WITH PROPOFOL  N/A 09/27/2018   Procedure: COLONOSCOPY WITH PROPOFOL ;  Surgeon: Jinny Carmine, MD;  Location: [facility];  Service: Endoscopy;  Laterality: N/A;   COLONOSCOPY WITH PROPOFOL  N/A 10/13/2021   Procedure: COLONOSCOPY WITH PROPOFOL ;  Surgeon: Jinny Carmine, MD;  Location: [facility];  Service: Endoscopy;  Laterality: N/A;  ASCENDING COLON POLYP X 2-- 1 ONE POLYP NOT RETRIEVED    ESOPHAGOGASTRODUODENOSCOPY (EGD) WITH PROPOFOL  N/A 11/18/2022   Procedure: ESOPHAGOGASTRODUODENOSCOPY (EGD) WITH PROPOFOL ;  Surgeon: Toledo, Ladell POUR, MD;  Location: [facility] END[facility]COPY;  Service: Gastroenterology;  Laterality: N/A;  IDDM   POLYPECTOMY  10/13/2021   Procedure: POLYPECTOMY;  Surgeon: Jinny Carmine, MD;  Location: [facility];  Service: Endoscopy;;   PROSTECTOMY       A IV Location/Drains/Wounds Patient Lines/Drains/Airways Status     Active Line/Drains/Airways     Name Placement date Placement time Site Days   Peripheral IV 07/29/24 18 G Anterior;Left;Upper Arm 07/29/24  2058  Arm  less than 1   Peripheral IV 07/29/24 20 G Anterior;Distal;Right;Upper Arm 07/29/24  2058  Arm  less than 1            Intake/Output Last 24 hours  Intake/Output Summary (Last 24 hours) at 07/29/2024 2348 Last data filed at 07/29/2024 2347 Gross per 24 hour  Intake 1000 ml  Output --  Net 1000 ml    Labs/Imaging Results for orders placed or performed during the hospital encounter of 07/29/24 (from the past 48 hours)  Lactic acid, plasma     Status: Abnormal   Collection Time: 07/29/24  8:32 PM  Result Value Ref Range   Lactic Acid, Venous 2.8 (HH) 0.5 - 1.9 mmol/L    Comment: Critical Value, Read Back and verified with Adrienne Pian 07/29/24 2130 KLW Performed at [facility], [address] [place], [place] [address]   CBC with Differential     Status: Abnormal   Collection Time: 07/29/24  8:32 PM  Result Value Ref Range   WBC 7.7 4.0 - 10.5 K/uL   RBC 3.07 (L) 4.22 - 5.81 MIL/uL   Hemoglobin 10.4 (L) 13.0 - 17.0 g/dL   HCT 67.9 (L) 60.9 - 47.9 %   MCV 104.2 (H) 80.0 - 100.0 fL   MCH 33.9 26.0 - 34.0 pg   MCHC 32.5 30.0 - 36.0 g/dL   RDW 86.2 88.4 - 84.4 %   Platelets 94 (L) 150 - 400 K/uL    Comment: Immature Platelet Fraction may be clinically indicated, consider ordering this additional test [id number]    nRBC 0.0 0.0 - 0.2 %   Neutrophils  Relative % 80 %   Neutro Abs 6.1 1.7 - 7.7 K/uL   Lymphocytes Relative 12 %   Lymphs Abs 0.9 0.7 - 4.0 K/uL   Monocytes Relative 8 %   Monocytes Absolute 0.6 0.1 - 1.0 K/uL   Eosinophils Relative 0 %   Eosinophils Absolute 0.0 0.0 - 0.5 K/uL   Basophils Relative 0 %   Basophils Absolute 0.0 0.0 - 0.1 K/uL   Immature Granulocytes 0 %   Abs Immature Granulocytes 0.03 0.00 - 0.07 K/uL    Comment: Performed at [facility], [address] [place], [place] [address]  Urinalysis, w/ Reflex to Culture (Infection Suspected) -Urine, Clean Catch     Status: Abnormal   Collection Time: 07/29/24  9:43 PM  Result Value Ref Range   Specimen Source URINE, CLEAN CATCH    Color, Urine YELLOW (A) YELLOW   APPearance CLEAR (A) CLEAR   Specific Gravity, Urine 1.025 1.005 - 1.030   pH 5.0 5.0 - 8.0   Glucose, UA >=500 (A) NEGATIVE mg/dL   Hgb urine dipstick NEGATIVE NEGATIVE   Bilirubin Urine NEGATIVE NEGATIVE   Ketones, ur 5 (A) NEGATIVE mg/dL   Protein, ur NEGATIVE NEGATIVE mg/dL   Nitrite NEGATIVE NEGATIVE   Leukocytes,Ua NEGATIVE NEGATIVE   RBC / HPF 0 0 - 5 RBC/hpf   WBC, UA 0-5 0 - 5 WBC/hpf    Comment:        Reflex urine culture not performed if WBC <=10, OR if Squamous epithelial cells >5. If Squamous epithelial cells >5 suggest recollection.    Bacteria, UA NONE SEEN NONE SEEN   Squamous Epithelial / HPF 0 0 - 5 /HPF   Mucus PRESENT     Comment: Performed at [facility], [address] [place], [place] [address]  Comprehensive metabolic panel     Status: Abnormal   Collection Time: 07/29/24  9:43 PM  Result Value Ref Range   Sodium 134 (L) 135 - 145 mmol/L   Potassium 4.2 3.5 - 5.1 mmol/L   Chloride 98 98 - 111 mmol/L   CO2 24 22 - 32 mmol/L   Glucose, Bld 188 (H) 70 - 99 mg/dL    Comment: Glucose reference range applies only to samples taken after fasting for at least 8 hours.   BUN 17 8 - 23 mg/dL   Creatinine, Ser 8.45 (H) 0.61 - 1.24 mg/dL   Calcium   8.6 (L) 8.9 - 10.3 mg/dL   Total Protein 6.8 6.5 - 8.1 g/dL   Albumin 4.0 3.5 - 5.0 g/dL   AST 28 15 - 41 U/L    Comment: HEMOLYSIS AT THIS LEVEL MAY AFFECT RESULT   ALT 22 0 -  44 U/L   Alkaline Phosphatase 55 38 - 126 U/L   Total Bilirubin 0.6 0.0 - 1.2 mg/dL   GFR, Estimated 45 (L) >60 mL/min    Comment: (NOTE) Calculated using the CKD-EPI Creatinine Equation (2021)    Anion gap 12 5 - 15    Comment: Performed at [facility], [address] [place], [place] [address]   DG Chest Port 1 View Result Date: 07/29/2024 EXAM: 1 VIEW(S) XRAY OF THE CHEST 07/29/2024 08:41:00 PM COMPARISON: 11/04/2017 CLINICAL HISTORY: Fever FINDINGS: LUNGS AND PLEURA: Low lung volume. No focal pulmonary opacity. No pleural effusion. No pneumothorax. HEART AND MEDIASTINUM: Aortic arch calcifications. No acute abnormality of the cardiac and mediastinal silhouettes. BONES AND SOFT TISSUES: Lower cervical spinal fixation hardware in place. No acute osseous abnormality. IMPRESSION: 1. No acute findings. Electronically signed by: Morgane Naveau MD 07/29/2024 09:29 PM EST RP Workstation: [id number]    Pending Labs Unresulted Labs (From admission, onward)     Start     Ordered   07/30/24 0500  Basic metabolic panel  Tomorrow morning,   R        07/29/24 2337   07/30/24 0500  CBC  Tomorrow morning,   R        07/29/24 2337   07/29/24 2028  Lactic acid, plasma  Now then every 2 hours,   STAT      07/29/24 2027            Vitals/Pain Today's Vitals   07/29/24 2011 07/29/24 2026 07/29/24 2107 07/29/24 2300  BP: (!) 141/72   131/61  Pulse: (!) 101   87  Resp: 20   20  Temp: (!) 102.6 F (39.2 C)   98.5 F (36.9 C)  TempSrc: Oral   Oral  SpO2: 95%  95% 92%  Weight: 95.7 kg     Height: 6' (1.829 m)     PainSc:  0-No pain      Isolation Precautions No active isolations  Medications Medications  aspirin  chewable tablet 81 mg (has no administration in time range)  insulin  glargine (1  Unit Dial) (TOUJEO ) Solostar Pen SOPN 60 Units (has no administration in time range)  pantoprazole  (PROTONIX ) EC tablet 40 mg (has no administration in time range)  linagliptin  (TRADJENTA ) tablet 5 mg (has no administration in time range)  cholecalciferol  (VITAMIN D3) 25 MCG (1000 UNIT) tablet 2,000 Units (has no administration in time range)  enoxaparin  (LOVENOX ) injection 40 mg (has no administration in time range)  0.9 %  sodium chloride  infusion (has no administration in time range)  acetaminophen  (TYLENOL ) tablet 650 mg (has no administration in time range)    Or  acetaminophen  (TYLENOL ) suppository 650 mg (has no administration in time range)  traZODone  (DESYREL ) tablet 25 mg (has no administration in time range)  magnesium  hydroxide (MILK OF MAGNESIA) suspension 30 mL (has no administration in time range)  ondansetron  (ZOFRAN ) tablet 4 mg (has no administration in time range)    Or  ondansetron  (ZOFRAN ) injection 4 mg (has no administration in time range)  ibuprofen  (ADVIL ) tablet 600 mg (600 mg Oral Given 07/29/24 2139)  sodium chloride  0.9 % bolus 1,000 mL (0 mLs Intravenous Stopped 07/29/24 2347)    Mobility walks     Focused Assessments Pulmonary Assessment Handoff:  Lung sounds:   O2 Device: Room Air      R Recommendations: See Admitting Provider Note  Report given to:   Additional Notes:       [1]  Allergies Allergen Reactions   Morphine And Codeine Other (See Comments)    Headaches  (pt denies 10/06/21)   Oxycodone Other (See Comments)    Other reaction(s): Hallucination (Pt denies 10/06/21)   Sulfa Antibiotics Other (See Comments), Hives and Itching    headaches

## 2024-07-29 NOTE — ED Provider Notes (Signed)
 "  [facility] Provider Note    Event Date/Time   First MD Initiated Contact with Patient 07/29/24 2035     (approximate)  History   Chief Complaint: Fever  HPI  Michael Ibarra is a 83 y.o. male with a past medical history of arthritis, diabetes, gastric reflux who presents to the emergency department for worsening weakness and hallucinations.  According to the family for the last 2 days patient has been coughing and has been very weak.  Patient noted to be febrile today.  Patient urinated on himself was confused and has been seeing and talking to things in the room that are not there.  They state normally patient can ambulate without any issues but today was unable to get off the couch.  They went to urgent care where the patient tested positive for influenza.  I reviewed patient's lab results he was influenza A+ today.  Urinalysis was normal.  Physical Exam   Triage Vital Signs: ED Triage Vitals  Encounter Vitals Group     BP 07/29/24 2011 (!) 141/72     Girls Systolic BP Percentile --      Girls Diastolic BP Percentile --      Boys Systolic BP Percentile --      Boys Diastolic BP Percentile --      Pulse Rate 07/29/24 2011 (!) 101     Resp 07/29/24 2011 20     Temp 07/29/24 2011 (!) 102.6 F (39.2 C)     Temp Source 07/29/24 2011 Oral     SpO2 07/29/24 2011 95 %     Weight 07/29/24 2011 211 lb (95.7 kg)     Height 07/29/24 2011 6' (1.829 m)     Head Circumference --      Peak Flow --      Pain Score 07/29/24 2026 0     Pain Loc --      Pain Education --      Exclude from Growth Chart --     Most recent vital signs: Vitals:   07/29/24 2011 07/29/24 2107  BP: (!) 141/72   Pulse: (!) 101   Resp: 20   Temp: (!) 102.6 F (39.2 C)   SpO2: 95% 95%    General: Awake, no distress.  CV:  Good peripheral perfusion.  Regular rate and rhythm around 100 bpm. Resp:  Normal effort.  Equal breath sounds bilaterally.  Abd:  No distention.  Soft,  nontender.  ED Results / Procedures / Treatments   RADIOLOGY  Chest x-ray viewed interpreted by myself shows no consolidation. Radiology is read as negative.   MEDICATIONS ORDERED IN ED: Medications  ibuprofen  (ADVIL ) tablet 600 mg (600 mg Oral Given 07/29/24 2139)  sodium chloride  0.9 % bolus 1,000 mL (1,000 mLs Intravenous New Bag/Given 07/29/24 2140)     IMPRESSION / MDM / ASSESSMENT AND PLAN / ED COURSE  I reviewed the triage vital signs and the nursing notes.  Patient's presentation is most consistent with acute presentation with potential threat to life or bodily function.  Patient presents to the emergency department for worsening weakness and hallucinations.  Patient tested positive for influenza A earlier today at urgent care.  Patient found to be febrile to 102.6 mildly tachycardic.  Patient is hallucinating at times in the emergency department.  Seems confused.  We will check labs we will IV hydrate we will dose antipyretics.  Given the patient's significant weakness inability to ambulate, patient will require admission to  hospital service for further workup and treatment.  Patient's workup has resulted showing a reassuring CBC with a normal white blood cell count.  Patient's chemistry shows mild renal insufficiency otherwise reassuring.  Patient's lactic acid slightly elevated 2.8.  Chest x-ray shows no acute findings.  Will admit to the hospital service for further workup and treatment.  FINAL CLINICAL IMPRESSION(S) / ED DIAGNOSES   Weakness Influenza A   Note:  This document was prepared using Dragon voice recognition software and may include unintentional dictation errors.   Dorothyann Drivers, MD 07/29/24 2303  "

## 2024-07-30 DIAGNOSIS — R531 Weakness: Secondary | ICD-10-CM

## 2024-07-30 DIAGNOSIS — E119 Type 2 diabetes mellitus without complications: Secondary | ICD-10-CM | POA: Diagnosis not present

## 2024-07-30 DIAGNOSIS — J101 Influenza due to other identified influenza virus with other respiratory manifestations: Secondary | ICD-10-CM | POA: Diagnosis not present

## 2024-07-30 DIAGNOSIS — K219 Gastro-esophageal reflux disease without esophagitis: Secondary | ICD-10-CM | POA: Diagnosis not present

## 2024-07-30 DIAGNOSIS — R4182 Altered mental status, unspecified: Secondary | ICD-10-CM | POA: Diagnosis not present

## 2024-07-30 DIAGNOSIS — E785 Hyperlipidemia, unspecified: Secondary | ICD-10-CM | POA: Diagnosis not present

## 2024-07-30 DIAGNOSIS — Z794 Long term (current) use of insulin: Secondary | ICD-10-CM | POA: Diagnosis not present

## 2024-07-30 LAB — CBC
HCT: 41.7 % (ref 39.0–52.0)
Hemoglobin: 14.1 g/dL (ref 13.0–17.0)
MCH: 33.5 pg (ref 26.0–34.0)
MCHC: 33.8 g/dL (ref 30.0–36.0)
MCV: 99 fL (ref 80.0–100.0)
Platelets: 132 K/uL — ABNORMAL LOW (ref 150–400)
RBC: 4.21 MIL/uL — ABNORMAL LOW (ref 4.22–5.81)
RDW: 13.7 % (ref 11.5–15.5)
WBC: 9.6 K/uL (ref 4.0–10.5)
nRBC: 0 % (ref 0.0–0.2)

## 2024-07-30 LAB — GLUCOSE, CAPILLARY
Glucose-Capillary: 163 mg/dL — ABNORMAL HIGH (ref 70–99)
Glucose-Capillary: 92 mg/dL (ref 70–99)
Glucose-Capillary: 178 mg/dL — ABNORMAL HIGH (ref 70–99)
Glucose-Capillary: 212 mg/dL — ABNORMAL HIGH (ref 70–99)
Glucose-Capillary: 276 mg/dL — ABNORMAL HIGH (ref 70–99)

## 2024-07-30 LAB — BASIC METABOLIC PANEL WITH GFR
Anion gap: 10 (ref 5–15)
BUN: 16 mg/dL (ref 8–23)
CO2: 24 mmol/L (ref 22–32)
Calcium: 8.3 mg/dL — ABNORMAL LOW (ref 8.9–10.3)
Chloride: 105 mmol/L (ref 98–111)
Creatinine, Ser: 1.46 mg/dL — ABNORMAL HIGH (ref 0.61–1.24)
GFR, Estimated: 48 mL/min — ABNORMAL LOW
Glucose, Bld: 126 mg/dL — ABNORMAL HIGH (ref 70–99)
Potassium: 3.8 mmol/L (ref 3.5–5.1)
Sodium: 139 mmol/L (ref 135–145)

## 2024-07-30 LAB — LACTIC ACID, PLASMA: Lactic Acid, Venous: 1 mmol/L (ref 0.5–1.9)

## 2024-07-30 LAB — HEMOGLOBIN A1C
Hgb A1c MFr Bld: 7.1 % — ABNORMAL HIGH (ref 4.8–5.6)
Mean Plasma Glucose: 157.07 mg/dL

## 2024-07-30 MED ORDER — AZITHROMYCIN 250 MG PO TABS
250.0000 mg | ORAL_TABLET | Freq: Every day | ORAL | Status: DC
  Administered 2024-07-31: 250 mg via ORAL
  Filled 2024-07-30: qty 1

## 2024-07-30 MED ORDER — INSULIN ASPART 100 UNIT/ML IJ SOLN
0.0000 [IU] | Freq: Three times a day (TID) | INTRAMUSCULAR | Status: DC
  Administered 2024-07-30: 3 [IU] via SUBCUTANEOUS
  Administered 2024-07-30: 2 [IU] via SUBCUTANEOUS
  Administered 2024-07-31: 1 [IU] via SUBCUTANEOUS
  Administered 2024-07-31: 2 [IU] via SUBCUTANEOUS
  Filled 2024-07-30: qty 3
  Filled 2024-07-30: qty 1
  Filled 2024-07-30 (×2): qty 2

## 2024-07-30 MED ORDER — PREDNISONE 50 MG PO TABS
50.0000 mg | ORAL_TABLET | Freq: Every day | ORAL | Status: DC
  Administered 2024-07-30 – 2024-07-31 (×2): 50 mg via ORAL
  Filled 2024-07-30 (×2): qty 1

## 2024-07-30 MED ORDER — IPRATROPIUM-ALBUTEROL 0.5-2.5 (3) MG/3ML IN SOLN
3.0000 mL | Freq: Four times a day (QID) | RESPIRATORY_TRACT | Status: DC
  Administered 2024-07-30 (×2): 3 mL via RESPIRATORY_TRACT
  Filled 2024-07-30 (×2): qty 3

## 2024-07-30 MED ORDER — DAPAGLIFLOZIN PROPANEDIOL 10 MG PO TABS
10.0000 mg | ORAL_TABLET | Freq: Every evening | ORAL | Status: DC
  Administered 2024-07-30: 10 mg via ORAL
  Filled 2024-07-30 (×2): qty 1

## 2024-07-30 MED ORDER — OSELTAMIVIR PHOSPHATE 30 MG PO CAPS
30.0000 mg | ORAL_CAPSULE | Freq: Two times a day (BID) | ORAL | Status: DC
  Administered 2024-07-30 – 2024-07-31 (×3): 30 mg via ORAL
  Filled 2024-07-30 (×3): qty 1

## 2024-07-30 MED ORDER — DM-GUAIFENESIN ER 30-600 MG PO TB12
1.0000 | ORAL_TABLET | Freq: Two times a day (BID) | ORAL | Status: DC
  Administered 2024-07-30 – 2024-07-31 (×3): 1 via ORAL
  Filled 2024-07-30 (×3): qty 1

## 2024-07-30 MED ORDER — INSULIN ASPART 100 UNIT/ML IJ SOLN
0.0000 [IU] | Freq: Every day | INTRAMUSCULAR | Status: DC
  Administered 2024-07-30: 3 [IU] via SUBCUTANEOUS
  Filled 2024-07-30: qty 3

## 2024-07-30 MED ORDER — GUAIFENESIN ER 600 MG PO TB12
600.0000 mg | ORAL_TABLET | Freq: Two times a day (BID) | ORAL | Status: DC
  Administered 2024-07-30: 600 mg via ORAL
  Filled 2024-07-30: qty 1

## 2024-07-30 MED ORDER — HYDROCODONE-ACETAMINOPHEN 5-325 MG PO TABS: 1.0000 | ORAL_TABLET | Freq: Four times a day (QID) | ORAL | Status: DC | PRN

## 2024-07-30 MED ORDER — IPRATROPIUM-ALBUTEROL 0.5-2.5 (3) MG/3ML IN SOLN
3.0000 mL | Freq: Three times a day (TID) | RESPIRATORY_TRACT | Status: DC
  Administered 2024-07-31: 3 mL via RESPIRATORY_TRACT
  Filled 2024-07-30: qty 3

## 2024-07-30 MED ORDER — HYDROCOD POLI-CHLORPHE POLI ER 10-8 MG/5ML PO SUER
5.0000 mL | Freq: Two times a day (BID) | ORAL | Status: DC | PRN
  Administered 2024-07-30 (×2): 5 mL via ORAL
  Filled 2024-07-30 (×2): qty 5

## 2024-07-30 MED ORDER — AZITHROMYCIN 500 MG PO TABS
500.0000 mg | ORAL_TABLET | Freq: Every day | ORAL | Status: AC
  Administered 2024-07-30: 500 mg via ORAL
  Filled 2024-07-30: qty 1

## 2024-07-30 MED ORDER — OSELTAMIVIR PHOSPHATE 75 MG PO CAPS: 75.0000 mg | ORAL_CAPSULE | Freq: Two times a day (BID) | ORAL | Status: DC

## 2024-07-30 NOTE — Assessment & Plan Note (Signed)
-

## 2024-07-30 NOTE — Evaluation (Signed)
 Occupational Therapy Evaluation Patient Details Name: Michael Ibarra MRN: [id number] DOB: [birth year] Today's Date: 07/30/2024   History of Present Illness   Pt is a 83 y.o. male admitted with generalized weakness in the setting of Influenza A. PMH significant for OA, Type II DM, GERD, OSA on CPAP, prostate cancer, back surgery.     Clinical Impressions Pt admitted with above. Prior to admission, pt lives with spouse and was independent without AD for ADL/mobility, no falls. Granddaughter and grandson in room. Pt eager to mobilize and appears at baseline for functional mobility/ADLs. Performs STS transfers and mobility without AD t/f bathroom for standing void, able to manage clothing independently, and completes 2 laps on unit. 1 minor LOB with quick turn in bathroom where pt is able to use wall to steady himself, no assist needed from therapist. SpO2 92% or above on RA with ambulation in hallway, recovers to 96% end of session. Pt without SOB throughout. Educated on activity pacing with PLB. No further acute needs identified, OT will complete orders and sign off.      If plan is discharge home, recommend the following:   Assist for transportation     Functional Status Assessment   Patient has not had a recent decline in their functional status     Equipment Recommendations   None recommended by OT      Precautions/Restrictions   Precautions Precautions: None Recall of Precautions/Restrictions: Intact Restrictions Weight Bearing Restrictions Per Provider Order: No     Mobility Bed Mobility Overal bed mobility: Modified Independent                  Transfers Overall transfer level: Modified independent Equipment used: None                      Balance Overall balance assessment: Mild deficits observed, not formally tested                                         ADL either performed or assessed with clinical judgement   ADL  Overall ADL's : Modified independent                                       General ADL Comments: Pt appears at baseline for functional mobility/ADL performance. Easily able to adjust socks in long sitting with figure four, performs STS transfers and mobilizes t/f bathroom independently without AD for standing void. 1 minor LOB with pt able to catch himself using wall support, no physical assist from therapist.      Pertinent Vitals/Pain Pain Assessment Pain Assessment: No/denies pain     Extremity/Trunk Assessment Upper Extremity Assessment Upper Extremity Assessment: Overall WFL for tasks assessed   Lower Extremity Assessment Lower Extremity Assessment: Overall WFL for tasks assessed   Cervical / Trunk Assessment Cervical / Trunk Assessment: Kyphotic   Communication Communication Communication: No apparent difficulties   Cognition Arousal: Alert Behavior During Therapy: WFL for tasks assessed/performed Cognition: No apparent impairments                               Following commands: Intact       Cueing  General Comments      Breathing treatment recieved prior  to session. Pt on RA with SpO2 92% with mobility in hallway, recovers to 96% end of session in bed. Left on RA, RN made aware. Pt was not SOB with ambulating.           Home Living Family/patient expects to be discharged to:: Private residence Living Arrangements: Spouse/significant other Available Help at Discharge: Family Type of Home: House Home Access: Stairs to enter Secretary/administrator of Steps: 1-2 Entrance Stairs-Rails: None Home Layout: One level     Bathroom Shower/Tub: Walk-in shower;Tub/shower unit         Home Equipment: None          Prior Functioning/Environment Prior Level of Function : Independent/Modified Independent             Mobility Comments: no AD ADLs Comments: independent    OT Problem List: Cardiopulmonary status limiting  activity        OT Goals(Current goals can be found in the care plan section)   Acute Rehab OT Goals OT Goal Formulation: All assessment and education complete, DC therapy Potential to Achieve Goals: Good   AM-PAC OT 6 Clicks Daily Activity     Outcome Measure Help from another person eating meals?: None Help from another person taking care of personal grooming?: None Help from another person toileting, which includes using toliet, bedpan, or urinal?: None Help from another person bathing (including washing, rinsing, drying)?: None Help from another person to put on and taking off regular upper body clothing?: None Help from another person to put on and taking off regular lower body clothing?: None 6 Click Score: 24   End of Session Equipment Utilized During Treatment: Gait belt Nurse Communication: Mobility status;Other (comment) (pt left on RA)  Activity Tolerance: Patient tolerated treatment well;No increased pain Patient left: in bed;with bed alarm set;with call bell/phone within reach;with family/visitor present  OT Visit Diagnosis: Other abnormalities of gait and mobility (R26.89)                Time: 8478-8457 OT Time Calculation (min): 21 min Charges:  OT General Charges $OT Visit: 1 Visit OT Evaluation $OT Eval Low Complexity: 1 Low OT Treatments $Self Care/Home Management : 8-22 mins Mirinda Monte L. Spike Desilets, OTR/L  07/30/2024, 3:53 PM

## 2024-07-30 NOTE — Plan of Care (Signed)

## 2024-07-30 NOTE — Assessment & Plan Note (Signed)
 Continue PPI therapy.

## 2024-07-30 NOTE — Progress Notes (Signed)
 SATURATION QUALIFICATIONS: (This note is used to comply with regulatory documentation for home oxygen)  Patient Saturations on Room Air at Rest = 94%%  Patient Saturations on Room Air while Ambulating = 91-93%   Please briefly explain why patient needs home oxygen: No O2 needs during ambulation.    Maryanne Finder, PT, DPT Physical Therapist - [place]  [facility]

## 2024-07-30 NOTE — Progress Notes (Signed)
 PHARMACY NOTE:  ANTIMICROBIAL RENAL DOSAGE ADJUSTMENT  Current antimicrobial regimen includes a mismatch between antimicrobial dosage and estimated renal function.  As per policy approved by the Pharmacy & Therapeutics and Medical Executive Committees, the antimicrobial dosage will be adjusted accordingly.  Current antimicrobial dosage:  Tamiflu  75mg  BID  Indication: Influenza A  Renal Function:  Estimated Creatinine Clearance: 47.6 mL/min (A) (by C-G formula based on SCr of 1.46 mg/dL (H)).     Antimicrobial dosage has been changed to:  Tamiflu  30mg  BID  Additional comments:CrCl 30-12ml   Thank you for allowing pharmacy to be a part of this patient's care.  Estill CHRISTELLA Lutes, PharmD, BCPS Clinical Pharmacist 07/30/2024 11:51 AM

## 2024-07-30 NOTE — Assessment & Plan Note (Addendum)
 This associated with significant generalized weakness, chest congestion and wheezing.   -Continue with Tamiflu  -Add prednisone , Zithromax , Mucinex  and DuoNeb - Continue with supportive care -Continue with supplemental oxygen-wean as tolerated, apparently was using for comfort with no documented hypoxia

## 2024-07-30 NOTE — Assessment & Plan Note (Addendum)
 A1c of 7.1 and CBG currently within goal. - Continue basal and SSI - Will continue Farxiga  and hold off metformin .

## 2024-07-30 NOTE — Evaluation (Signed)
 Physical Therapy Evaluation Patient Details Name: Michael Ibarra MRN: [id number] DOB: 11-06-1940 Today's Date: 07/30/2024  History of Present Illness  Pt is a 83 y.o. male admitted with generalized weakness in the setting of Influenza A. PMH significant for OA, Type II DM, GERD, OSA on CPAP, prostate cancer, back surgery.  Clinical Impression  Patient admitted with the above. PTA, patient lives with wife and was independent with no AD. Granddaughter and other family member in the room. Patient functioning at modified independent level with no AD. Ambulated on room air with spO2 91-93% throughout with notable SOB with increasing distance and ambulating while talking. Patient reports being at baseline for mobility despite SOB. No further skilled PT needs identified acutely. No PT follow up recommended at this time. PT will complete orders.         If plan is discharge home, recommend the following:     Can travel by private vehicle        Equipment Recommendations None recommended by PT  Recommendations for Other Services       Functional Status Assessment Patient has had a recent decline in their functional status and demonstrates the ability to make significant improvements in function in a reasonable and predictable amount of time.     Precautions / Restrictions Precautions Precautions: None Recall of Precautions/Restrictions: Intact Restrictions Weight Bearing Restrictions Per Provider Order: No      Mobility  Bed Mobility Overal bed mobility: Modified Independent                  Transfers Overall transfer level: Modified independent Equipment used: None                    Ambulation/Gait Ambulation/Gait assistance: Modified independent (Device/Increase time) Gait Distance (Feet): 600 Feet Assistive device: None Gait Pattern/deviations: Step-through pattern, Decreased stride length   Gait velocity interpretation: >4.37 ft/sec, indicative of normal  walking speed      Stairs            Wheelchair Mobility     Tilt Bed    Modified Rankin (Stroke Patients Only)       Balance Overall balance assessment: Mild deficits observed, not formally tested                                           Pertinent Vitals/Pain Pain Assessment Pain Assessment: No/denies pain    Home Living Family/patient expects to be discharged to:: Private residence Living Arrangements: Spouse/significant other Available Help at Discharge: Family Type of Home: House Home Access: Stairs to enter Entrance Stairs-Rails: None Secretary/administrator of Steps: 1-2   Home Layout: One level Home Equipment: None      Prior Function Prior Level of Function : Independent/Modified Independent             Mobility Comments: no AD       Extremity/Trunk Assessment   Upper Extremity Assessment Upper Extremity Assessment: Generalized weakness    Lower Extremity Assessment Lower Extremity Assessment: Generalized weakness    Cervical / Trunk Assessment Cervical / Trunk Assessment: Kyphotic  Communication   Communication Communication: No apparent difficulties    Cognition Arousal: Alert Behavior During Therapy: WFL for tasks assessed/performed   PT - Cognitive impairments: No apparent impairments  Following commands: Intact       Cueing       General Comments General comments (skin integrity, edema, etc.): On RA throughout, spO2 91-93% with activity. SOB noted with increasing distance and ambulating while talking    Exercises     Assessment/Plan    PT Assessment Patient does not need any further PT services  PT Problem List         PT Treatment Interventions      PT Goals (Current goals can be found in the Care Plan section)  Acute Rehab PT Goals Patient Stated Goal: to go home PT Goal Formulation: All assessment and education complete, DC therapy    Frequency        Co-evaluation               AM-PAC PT 6 Clicks Mobility  Outcome Measure Help needed turning from your back to your side while in a flat bed without using bedrails?: None Help needed moving from lying on your back to sitting on the side of a flat bed without using bedrails?: None Help needed moving to and from a bed to a chair (including a wheelchair)?: None Help needed standing up from a chair using your arms (e.g., wheelchair or bedside chair)?: None Help needed to walk in hospital room?: None Help needed climbing 3-5 steps with a railing? : None 6 Click Score: 24    End of Session   Activity Tolerance: Patient tolerated treatment well Patient left: in bed;with call bell/phone within reach;with family/visitor present Nurse Communication: Mobility status PT Visit Diagnosis: Muscle weakness (generalized) (M62.81)    Time: 8660-8594 PT Time Calculation (min) (ACUTE ONLY): 26 min   Charges:   PT Evaluation $PT Eval Low Complexity: 1 Low PT Treatments $Therapeutic Activity: 8-22 mins PT General Charges $$ ACUTE PT VISIT: 1 Visit         Maryanne Finder, PT, DPT Physical Therapist - [facility]  [facility]   Joelene Barriere A Shaconda Hajduk 07/30/2024, 2:09 PM

## 2024-07-30 NOTE — Progress Notes (Signed)
" °  Progress Note   Patient: [name] FMW:[id number] DOB: [birth year] DOA: 07/29/2024     0 DOS: the patient was seen and examined on 07/30/2024   Brief hospital course: Partly taken from H&P.  Michael Ibarra is a 83 y.o. Caucasian male with medical history significant for osteoarthritis, type 2 diabetes mellitus, GERD, OSA on CPAP, who presented to the emergency room with new onset of generalized weakness since Christmas night when he started feeling bad.  He has been having cough productive of yellow sputum without significant dyspnea or wheezing.   On presentation patient was febrile at 102.6, mild tachycardia and saturating well on room air.  Labs with hemoglobin of 10.4, creatinine 1.54, lactic acid 2.8>> 1.0, UA with glucose urea.  CXR was negative for any acute abnormality, respiratory panel positive for influenza A.  Patient was started on Tamiflu   12/28: Afebrile, on 2 L of oxygen with no documented hypoxia.  Labs stable with some improvement of creatinine to 1.46, A1c of 7.1.  Significant cough and congestion, started on prednisone , Zithromax  and breathing treatments.  Assessment and Plan: * Influenza A This associated with significant generalized weakness, chest congestion and wheezing.   -Continue with Tamiflu  -Add prednisone , Zithromax , Mucinex  and DuoNeb - Continue with supportive care -Continue with supplemental oxygen-wean as tolerated, apparently was using for comfort with no documented hypoxia  Type 2 diabetes mellitus without complications (HCC) A1c of 7.1 and CBG currently within goal. - Continue basal and SSI - Will continue Farxiga  and hold off metformin .  GERD without esophagitis - Continue PPI therapy.  Dyslipidemia - Will continue statin therapy.  Generalized weakness PT/OT evaluation   Subjective: Patient is having significant cough, congestion and shortness of breath, was on 2 L of oxygen for comfort.  Physical Exam: Vitals:   07/29/24 2107  07/29/24 2300 07/30/24 0051 07/30/24 0758  BP:  131/61 124/62 122/61  Pulse:  87 75 71  Resp:  20 16 18   Temp:  98.5 F (36.9 C) 98.4 F (36.9 C) 98.9 F (37.2 C)  TempSrc:  Oral  Oral  SpO2: 95% 92% 93% 95%  Weight:      Height:       General.  Overweight elderly man, in no acute distress. Pulmonary.  Mild scattered wheeze bilaterally, normal respiratory effort. CV.  Regular rate and rhythm, no JVD, rub or murmur. Abdomen.  Soft, nontender, nondistended, BS positive. CNS.  Alert and oriented .  No focal neurologic deficit. Extremities.  No edema, pulses intact and symmetrical. Psychiatry.  Judgment and insight appears normal.   Data Reviewed: Prior data reviewed  Family Communication: Discussed with grand daughter and son at bedside  Disposition: Status is: Observation The patient will require care spanning > 2 midnights and should be moved to inpatient because: Severity of illness  Planned Discharge Destination: Home  DVT prophylaxis.  Lovenox  Time spent: 50 minutes  This record has been created using Conservation officer, historic buildings. Errors have been sought and corrected,but may not always be located. Such creation errors do not reflect on the standard of care.   Author: Amaryllis Dare, MD 07/30/2024 2:02 PM  For on call review [email or web address].  "

## 2024-07-30 NOTE — Plan of Care (Signed)

## 2024-07-30 NOTE — Hospital Course (Addendum)
 Partly taken from H&P.  Michael Ibarra is a 83 y.o. Caucasian male with medical history significant for osteoarthritis, type 2 diabetes mellitus, GERD, OSA on CPAP, who presented to the emergency room with new onset of generalized weakness since Christmas night when he started feeling bad.  He has been having cough productive of yellow sputum without significant dyspnea or wheezing.   On presentation patient was febrile at 102.6, mild tachycardia and saturating well on room air.  Labs with hemoglobin of 10.4, creatinine 1.54, lactic acid 2.8>> 1.0, UA with glucose urea.  CXR was negative for any acute abnormality, respiratory panel positive for influenza A.  Patient was started on Tamiflu   12/28: Afebrile, on 2 L of oxygen with no documented hypoxia.  Labs stable with some improvement of creatinine to 1.46, A1c of 7.1.  Significant cough and congestion, started on prednisone , Zithromax  and breathing treatments.  12/29: Remained hemodynamically stable, improving renal function.  Mild leukocytosis likely secondary to the steroid use.  Cough improving.  Patient was ambulated without any significant desaturation.  He is being discharged on Zithromax , prednisone , Tamiflu  and supportive care.  Patient will continue with his home medications and need to have a close follow-up with his primary care provider for further assistance.

## 2024-07-30 NOTE — Assessment & Plan Note (Signed)
 Will continue statin therapy

## 2024-07-31 ENCOUNTER — Other Ambulatory Visit: Payer: Self-pay

## 2024-07-31 DIAGNOSIS — Z794 Long term (current) use of insulin: Secondary | ICD-10-CM | POA: Diagnosis not present

## 2024-07-31 DIAGNOSIS — J101 Influenza due to other identified influenza virus with other respiratory manifestations: Secondary | ICD-10-CM | POA: Diagnosis not present

## 2024-07-31 DIAGNOSIS — R531 Weakness: Secondary | ICD-10-CM | POA: Diagnosis not present

## 2024-07-31 DIAGNOSIS — R4182 Altered mental status, unspecified: Secondary | ICD-10-CM | POA: Diagnosis not present

## 2024-07-31 DIAGNOSIS — K219 Gastro-esophageal reflux disease without esophagitis: Secondary | ICD-10-CM | POA: Diagnosis not present

## 2024-07-31 DIAGNOSIS — E119 Type 2 diabetes mellitus without complications: Secondary | ICD-10-CM | POA: Diagnosis not present

## 2024-07-31 DIAGNOSIS — E785 Hyperlipidemia, unspecified: Secondary | ICD-10-CM | POA: Diagnosis not present

## 2024-07-31 LAB — CBC
HCT: 39.6 % (ref 39.0–52.0)
Hemoglobin: 13.4 g/dL (ref 13.0–17.0)
MCH: 33.1 pg (ref 26.0–34.0)
MCHC: 33.8 g/dL (ref 30.0–36.0)
MCV: 97.8 fL (ref 80.0–100.0)
Platelets: 141 K/uL — ABNORMAL LOW (ref 150–400)
RBC: 4.05 MIL/uL — ABNORMAL LOW (ref 4.22–5.81)
RDW: 13.3 % (ref 11.5–15.5)
WBC: 11.3 K/uL — ABNORMAL HIGH (ref 4.0–10.5)
nRBC: 0 % (ref 0.0–0.2)

## 2024-07-31 LAB — BASIC METABOLIC PANEL WITH GFR
Anion gap: 10 (ref 5–15)
BUN: 20 mg/dL (ref 8–23)
CO2: 23 mmol/L (ref 22–32)
Calcium: 8.9 mg/dL (ref 8.9–10.3)
Chloride: 106 mmol/L (ref 98–111)
Creatinine, Ser: 1.35 mg/dL — ABNORMAL HIGH (ref 0.61–1.24)
GFR, Estimated: 53 mL/min — ABNORMAL LOW
Glucose, Bld: 122 mg/dL — ABNORMAL HIGH (ref 70–99)
Potassium: 3.9 mmol/L (ref 3.5–5.1)
Sodium: 140 mmol/L (ref 135–145)

## 2024-07-31 LAB — GLUCOSE, CAPILLARY
Glucose-Capillary: 127 mg/dL — ABNORMAL HIGH (ref 70–99)
Glucose-Capillary: 154 mg/dL — ABNORMAL HIGH (ref 70–99)

## 2024-07-31 MED ORDER — AZITHROMYCIN 250 MG PO TABS
250.0000 mg | ORAL_TABLET | Freq: Every day | ORAL | 0 refills | Status: AC
  Filled 2024-07-31: qty 4, 4d supply, fill #0

## 2024-07-31 MED ORDER — DM-GUAIFENESIN ER 30-600 MG PO TB12
1.0000 | ORAL_TABLET | Freq: Two times a day (BID) | ORAL | 0 refills | Status: AC
  Filled 2024-07-31: qty 30, 15d supply, fill #0

## 2024-07-31 MED ORDER — BENZONATATE 200 MG PO CAPS
200.0000 mg | ORAL_CAPSULE | Freq: Three times a day (TID) | ORAL | 0 refills | Status: AC | PRN
  Filled 2024-07-31: qty 20, 7d supply, fill #0

## 2024-07-31 MED ORDER — OSELTAMIVIR PHOSPHATE 30 MG PO CAPS
30.0000 mg | ORAL_CAPSULE | Freq: Two times a day (BID) | ORAL | 0 refills | Status: AC
  Filled 2024-07-31: qty 8, 4d supply, fill #0

## 2024-07-31 MED ORDER — PREDNISONE 50 MG PO TABS
50.0000 mg | ORAL_TABLET | Freq: Every day | ORAL | 0 refills | Status: AC
  Filled 2024-07-31: qty 4, 4d supply, fill #0

## 2024-07-31 NOTE — Plan of Care (Signed)

## 2024-07-31 NOTE — Progress Notes (Signed)
 Patient is being discharged to home. Granddaughter at bedside. Patient is alert and oriented. He is able to ambulate at will. Meds will be delivered to bedside. IV to right and left  arms have been removed. Catheter and  tip are both intact. Gauze and tape applied to removal sites. Staff to transport patient to lobby for exit.

## 2024-07-31 NOTE — Discharge Summary (Signed)
 " Physician Discharge Summary   Patient: Michael Ibarra MRN: [id number] DOB: 1940-10-30  Admit date:     07/29/2024  Discharge date: 07/31/2024  Discharge Physician: Amaryllis Dare   PCP: Epifanio Alm SQUIBB, MD   Recommendations at discharge:  Please obtain CBC and BMP on follow-up Please ensure completion of Tamiflu , prednisone  and Zithromax  Follow-up with primary care provider within a week  Discharge Diagnoses: Principal Problem:   Influenza A Active Problems:   Type 2 diabetes mellitus without complications (HCC)   GERD without esophagitis   Dyslipidemia   Generalized weakness   Altered mental status   Hospital Course: Partly taken from H&P.  Michael Ibarra is a 83 y.o. Caucasian male with medical history significant for osteoarthritis, type 2 diabetes mellitus, GERD, OSA on CPAP, who presented to the emergency room with new onset of generalized weakness since Christmas night when he started feeling bad.  He has been having cough productive of yellow sputum without significant dyspnea or wheezing.   On presentation patient was febrile at 102.6, mild tachycardia and saturating well on room air.  Labs with hemoglobin of 10.4, creatinine 1.54, lactic acid 2.8>> 1.0, UA with glucose urea.  CXR was negative for any acute abnormality, respiratory panel positive for influenza A.  Patient was started on Tamiflu   12/28: Afebrile, on 2 L of oxygen with no documented hypoxia.  Labs stable with some improvement of creatinine to 1.46, A1c of 7.1.  Significant cough and congestion, started on prednisone , Zithromax  and breathing treatments.  12/29: Remained hemodynamically stable, improving renal function.  Mild leukocytosis likely secondary to the steroid use.  Cough improving.  Patient was ambulated without any significant desaturation.  He is being discharged on Zithromax , prednisone , Tamiflu  and supportive care.  Patient will continue with his home medications and need to have a close  follow-up with his primary care provider for further assistance.  Assessment and Plan: * Influenza A This associated with significant generalized weakness, chest congestion and wheezing.   -Continue with Tamiflu  -Add prednisone , Zithromax , Mucinex  and DuoNeb - Continue with supportive care - No significant desaturation with ambulation so he does not required home oxygen.  Type 2 diabetes mellitus without complications (HCC) A1c of 7.1 and CBG currently within goal. - Continue home meds  GERD without esophagitis - Continue PPI therapy.  Dyslipidemia - Will continue statin therapy.  Generalized weakness PT/OT evaluation-no recommended follow-up  Consultants: None Procedures performed: None Disposition: Home Diet recommendation:  Carb modified diet DISCHARGE MEDICATION: Allergies as of 07/31/2024       Reactions   Morphine And Codeine Other (See Comments)   Headaches  (pt denies 10/06/21)   Oxycodone Other (See Comments)   Other reaction(s): Hallucination (Pt denies 10/06/21)   Sulfa Antibiotics Other (See Comments), Hives, Itching   headaches        Medication List     STOP taking these medications    promethazine-dextromethorphan  6.25-15 MG/5ML syrup Commonly known as: PROMETHAZINE-DM       TAKE these medications    aspirin  EC 81 MG tablet Take 81 mg by mouth daily.   azithromycin  250 MG tablet Commonly known as: ZITHROMAX  Take 1 tablet (250 mg total) by mouth daily for 4 days.   B-D ULTRAFINE III SHORT PEN 31G X 8 MM Misc Generic drug: Insulin  Pen Needle USE 1 PEN NEEDLE DAILY   benzonatate  200 MG capsule Commonly known as: TESSALON  Take 1 capsule (200 mg total) by mouth 3 (three) times daily as needed  for cough.   dapagliflozin  propanediol 10 MG Tabs tablet Commonly known as: FARXIGA  Take 10 mg by mouth every evening.   dextromethorphan -guaiFENesin  30-600 MG 12hr tablet Commonly known as: MUCINEX  DM Take 1 tablet by mouth 2 (two) times  daily.   HYDROcodone -acetaminophen  5-325 MG tablet Commonly known as: NORCO/VICODIN Take 1 tablet by mouth every 6 (six) hours as needed for moderate pain (pain score 4-6).   linaclotide 72 MCG capsule Commonly known as: LINZESS Take 72 mcg by mouth daily as needed.   metFORMIN  1000 MG tablet Commonly known as: GLUCOPHAGE  Take 1,000 mg by mouth 2 (two) times daily with a meal.   OneTouch Ultra test strip Generic drug: glucose blood Use to check blood sugar twice daily   oseltamivir  30 MG capsule Commonly known as: TAMIFLU  Take 1 capsule (30 mg total) by mouth 2 (two) times daily for 4 days. What changed:  medication strength how much to take   pantoprazole  40 MG tablet Commonly known as: PROTONIX  Take 1 tablet (40 mg total) by mouth daily. What changed: when to take this   predniSONE  50 MG tablet Commonly known as: DELTASONE  Take 1 tablet (50 mg total) by mouth daily with breakfast for 4 days.   sitaGLIPtin  100 MG tablet Commonly known as: Januvia  Take 1 tablet (100 mg total) by mouth daily. What changed: when to take this   Toujeo  SoloStar 300 UNIT/ML Solostar Pen Generic drug: insulin  glargine (1 Unit Dial) INJECT 60 UNITS UNDER THE SKIN DAILY What changed:  how much to take how to take this when to take this additional instructions        Follow-up Information     Epifanio Alm SQUIBB, MD. Schedule an appointment as soon as possible for a visit in 1 week(s).   Specialty: Infectious Diseases Contact information: [address] [place] [address] [phone]                Discharge Exam: Fredricka Weights   07/29/24 2011  Weight: 95.7 kg   General.  Well-developed elderly man, in no acute distress. Pulmonary.  Lungs clear bilaterally, normal respiratory effort. CV.  Regular rate and rhythm, no JVD, rub or murmur. Abdomen.  Soft, nontender, nondistended, BS positive. CNS.  Alert and oriented .  No focal neurologic  deficit. Extremities.  No edema,  pulses intact and symmetrical. Psychiatry.  Judgment and insight appears normal.   Condition at discharge: stable  The results of significant diagnostics from this hospitalization (including imaging, microbiology, ancillary and laboratory) are listed below for reference.   Imaging Studies: DG Chest Port 1 View Result Date: 07/29/2024 EXAM: 1 VIEW(S) XRAY OF THE CHEST 07/29/2024 08:41:00 PM COMPARISON: 11/04/2017 CLINICAL HISTORY: Fever FINDINGS: LUNGS AND PLEURA: Low lung volume. No focal pulmonary opacity. No pleural effusion. No pneumothorax. HEART AND MEDIASTINUM: Aortic arch calcifications. No acute abnormality of the cardiac and mediastinal silhouettes. BONES AND SOFT TISSUES: Lower cervical spinal fixation hardware in place. No acute osseous abnormality. IMPRESSION: 1. No acute findings. Electronically signed by: Morgane Naveau MD 07/29/2024 09:29 PM EST RP Workstation: [id number]    Microbiology: No results found for this or any previous visit.  Labs: CBC: Recent Labs  Lab 07/29/24 2032 07/30/24 0740 07/31/24 0821  WBC 7.7 9.6 11.3*  NEUTROABS 6.1  --   --   HGB 10.4* 14.1 13.4  HCT 32.0* 41.7 39.6  MCV 104.2* 99.0 97.8  PLT 94* 132* 141*   Basic Metabolic Panel: Recent Labs  Lab 07/29/24 2143 07/30/24  9493 07/31/24 0821  NA 134* 139 140  K 4.2 3.8 3.9  CL 98 105 106  CO2 24 24 23   GLUCOSE 188* 126* 122*  BUN 17 16 20   CREATININE 1.54* 1.46* 1.35*  CALCIUM  8.6* 8.3* 8.9   Liver Function Tests: Recent Labs  Lab 07/29/24 2143  AST 28  ALT 22  ALKPHOS 55  BILITOT 0.6  PROT 6.8  ALBUMIN 4.0   CBG: Recent Labs  Lab 07/30/24 0743 07/30/24 1151 07/30/24 1714 07/30/24 2118 07/31/24 0753  GLUCAP 92 178* 212* 276* 127*    Discharge time spent: greater than 30 minutes.  This record has been created using Conservation officer, historic buildings. Errors have been sought and corrected,but may not always be located. Such  creation errors do not reflect on the standard of care.   Signed: Amaryllis Dare, MD [facility] 07/31/2024 "

## 2024-07-31 NOTE — Plan of Care (Signed)
" °  Problem: Nutritional: Goal: Progress toward achieving an optimal weight will improve Outcome: Progressing   Problem: Skin Integrity: Goal: Risk for impaired skin integrity will decrease Outcome: Progressing   Problem: Coping: Goal: Ability to adjust to condition or change in health will improve Outcome: Progressing   "

## 2024-08-24 ENCOUNTER — Telehealth: Payer: Self-pay

## 2024-08-24 NOTE — Telephone Encounter (Signed)
 Pt would like to have colonoscopy and would like for Dr jinny to preform the procedure.

## 2024-10-03 ENCOUNTER — Ambulatory Visit: Admitting: Family Medicine
